# Patient Record
Sex: Female | Born: 1995 | Race: White | Hispanic: No | Marital: Married | Smoking: Never smoker
Health system: Southern US, Community
[De-identification: ages and names within clinical notes are randomized; demographics above are authoritative.]

## PROBLEM LIST (undated history)

## (undated) ENCOUNTER — Inpatient Hospital Stay (HOSPITAL_COMMUNITY): Payer: Self-pay

## (undated) DIAGNOSIS — F419 Anxiety disorder, unspecified: Secondary | ICD-10-CM

## (undated) DIAGNOSIS — F909 Attention-deficit hyperactivity disorder, unspecified type: Secondary | ICD-10-CM

## (undated) HISTORY — DX: Anxiety disorder, unspecified: F41.9

## (undated) HISTORY — DX: Attention-deficit hyperactivity disorder, unspecified type: F90.9

---

## 2016-01-27 DIAGNOSIS — F988 Other specified behavioral and emotional disorders with onset usually occurring in childhood and adolescence: Secondary | ICD-10-CM | POA: Insufficient documentation

## 2019-08-26 DIAGNOSIS — F419 Anxiety disorder, unspecified: Secondary | ICD-10-CM | POA: Insufficient documentation

## 2020-05-08 NOTE — L&D Delivery Note (Addendum)
OB/GYN Faculty Practice Delivery Note  Carol Clark is a 25 y.o. G1P0000 s/p VAVD at [redacted]w[redacted]d. She was admitted for SROM.   ROM: 29h 67m with clear fluid GBS Status: negative Maximum Maternal Temperature: 99  Labor Progress: Presented SROM, was in the MAU while awaiting bed. Once up on L&D not in labor despite SROM for > 6hours so cooks catheter inserted and buccal cytotec given. Patient's cooks catheter eventually came out and she was started on pitocin. Fetal heart rate with intermittent periods of prolonged decelerations so pitocin turned off. Ultimately she progressed to complete with expectant management  Delivery Date/Time: 1715 on 12/2 Delivery: Called to room and patient was complete and pushing. Due to fetal recurrent decelerations to the 90s with every contraction vacuum delivery was offered. Verbal consent: obtained from patient.  Risks and benefits discussed in detail.  Risks include, but are not limited to the risks of anesthesia, bleeding, infection, damage to maternal tissues, fetal cephalhematoma.  There is also the risk of inability to effect vaginal delivery of the head, or shoulder dystocia that cannot be resolved by established maneuvers, leading to the need for emergency cesarean section.  Mytivac bell vacuum applied to fetal head at +2 station and pulled on with contractions. Head delivered OA with significant caput and appeared to likely be in asynclitic position previously. No nuchal cord present. Shoulder and body delivered in usual fashion. Infant with spontaneous cry, placed on mother's abdomen, dried and stimulated. Cord clamped x 2 within 10 seconds of delivery due to insufficient respiratory effort and cut by father of baby. Cord blood drawn. Placenta delivered spontaneously with gentle cord traction. Fundus firm with massage and Pitocin. Labia, perineum, vagina, and cervix inspected and found to have a second degree laceration that was repaired with 3-0 vicryl and also  bilateral periurethrals that were repaired with 4-0 monocryl interrupted sutures..   APGAR: 5, 8; weight  .   Placenta status: intact - sent to pathology   Cord: 3VC with the following complications: none.  Anesthesia: Epidural Instruments: MityVac Episiotomy: None Lacerations: 2nd degree;Perineal Suture Repair: 3.0 vicryl, 4-0 Monocryl Est. Blood Loss (mL):     Mom to postpartum.  Baby to Couplet care / Skin to Skin.  Dr. Alvester Morin present for entire delivery  Carol Clark 04/08/2021, 5:59 PM  Carol Mccreedy, MD, MPH OB Fellow, [facility] for [facility], [facility] 04/08/2021, 6:18 PM

## 2020-05-19 ENCOUNTER — Other Ambulatory Visit: Payer: Self-pay

## 2020-05-19 ENCOUNTER — Other Ambulatory Visit (HOSPITAL_COMMUNITY)
Admission: RE | Admit: 2020-05-19 | Discharge: 2020-05-19 | Disposition: A | Discharge status: Home / Self Care | Payer: 59 | Source: Ambulatory Visit | Attending: Obstetrics & Gynecology | Admitting: Obstetrics & Gynecology

## 2020-05-19 ENCOUNTER — Ambulatory Visit: Payer: 59 | Admitting: Obstetrics & Gynecology

## 2020-05-19 ENCOUNTER — Encounter: Payer: Self-pay | Admitting: Obstetrics & Gynecology

## 2020-05-19 VITALS — BP 108/65 | HR 67 | Ht 66.0 in | Wt 174.0 lb

## 2020-05-19 DIAGNOSIS — Z01419 Encounter for gynecological examination (general) (routine) without abnormal findings: Secondary | ICD-10-CM

## 2020-05-19 DIAGNOSIS — L700 Acne vulgaris: Secondary | ICD-10-CM | POA: Diagnosis not present

## 2020-05-19 DIAGNOSIS — N920 Excessive and frequent menstruation with regular cycle: Secondary | ICD-10-CM | POA: Diagnosis not present

## 2020-05-19 DIAGNOSIS — Z124 Encounter for screening for malignant neoplasm of cervix: Secondary | ICD-10-CM

## 2020-05-19 DIAGNOSIS — Z30432 Encounter for removal of intrauterine contraceptive device: Secondary | ICD-10-CM

## 2020-05-19 NOTE — Progress Notes (Signed)
Patient just moved to the area. Patient would like her IUD removed - her and her husband would like to try and start a family. Armandina Stammer RN

## 2020-05-19 NOTE — Progress Notes (Signed)
25 y.o. G0P0000 Married White or Caucasian female here for new patient exam/establishing care.  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  She has a paraguard IUD placed in 2019 at [facility].  She can feel her strings.  she is ready to start trying for pregnancy.  Prenatal vitamin recommended.  She has been on iron due to anemia she developed with the Paragard IUD.        Sexually active: Yes.    The current method of family planning is IUD.    Exercising: Yes.   Smoker:  no  Health Maintenance: Pap:  2019, does not have results History of abnormal Pap:  no MMG:  never   reports that she has never smoked. She has never used smokeless tobacco. She reports that she does not drink alcohol and does not use drugs.  History reviewed. No pertinent past medical history.  History reviewed. No pertinent surgical history.  Current Outpatient Medications  Medication Sig Dispense Refill  . escitalopram (LEXAPRO) 20 MG tablet Take by mouth.    . paragard intrauterine copper IUD IUD by Intrauterine route.     No current facility-administered medications for this visit.    Family History  Problem Relation Age of Onset  . Hypertension Father   . Cancer Neg Hx   . Diabetes Neg Hx     Review of Systems  Genitourinary: Positive for menstrual problem (flow is heavier and longer since paragard placement).  All other systems reviewed and are negative.   Exam:   BP 108/65   Pulse 67   Ht 5\' 6"  (1.676 m)   Wt 174 lb (78.9 kg)   BMI 28.08 kg/m   Height: 5\' 6"  (167.6 cm)  General appearance: alert, cooperative and appears stated age Head: Normocephalic, without obvious abnormality, atraumatic Neck: no adenopathy, supple, symmetrical, trachea midline and thyroid normal to inspection and palpation Lungs: clear to auscultation bilaterally Breasts: normal appearance, no masses or tenderness Heart: regular rate and rhythm Abdomen: soft,  non-tender; bowel sounds normal; no masses,  no organomegaly Extremities: extremities normal, atraumatic, no cyanosis or edema Skin: Skin color, texture, turgor normal. No rashes or lesions Lymph nodes: Cervical, supraclavicular, and axillary nodes normal. No abnormal inguinal nodes palpated Neurologic: Grossly normal   Pelvic: External genitalia:  no lesions              Urethra:  normal appearing urethra with no masses, tenderness or lesions              Bartholins and Skenes: normal                 Vagina: normal appearing vagina with normal color and discharge, no lesions              Cervix: no lesions and IUD string noted              Pap taken: Yes.   Bimanual Exam:  Uterus:  normal size, contour, position, consistency, mobility, non-tender              Adnexa: normal adnexa and no mass, fullness, tenderness               Rectovaginal: Confirms               Anus:  normal sphincter tone, no lesions  Procedure:  Speculum placed.  IUD string noted.  String grasped with ringed forceps and removed with one  pull.  Pt tolerated procedure well.  Minimal bleeding noted.  Speculum removed.  Chaperone, Lorelle Gibbs, CMA, was present for exam.  Assessment/Plan: 1. Well woman exam with routine gynecological exam  2. Cervical cancer screening - Cytology - PAP( [place])  3. Menorrhagia with regular cycle -this was related to paragard placement.  Pt knows to call if has any future bleeding concerns.  4. Acne vulgaris  5. Encounter for IUD removal -s/p removal today  6.  Anxiety -on Lexapro.  Does not need rx.

## 2020-05-21 LAB — CYTOLOGY - PAP
Comment: NEGATIVE
Diagnosis: UNDETERMINED — AB
High risk HPV: NEGATIVE

## 2020-08-30 ENCOUNTER — Other Ambulatory Visit: Payer: Self-pay

## 2020-08-30 ENCOUNTER — Encounter (HOSPITAL_BASED_OUTPATIENT_CLINIC_OR_DEPARTMENT_OTHER): Payer: Self-pay | Admitting: Obstetrics & Gynecology

## 2020-08-30 ENCOUNTER — Ambulatory Visit (INDEPENDENT_AMBULATORY_CARE_PROVIDER_SITE_OTHER): Payer: 59 | Admitting: Obstetrics & Gynecology

## 2020-08-30 VITALS — BP 118/71 | HR 84 | Ht 66.0 in | Wt 186.0 lb

## 2020-08-30 DIAGNOSIS — F419 Anxiety disorder, unspecified: Secondary | ICD-10-CM | POA: Diagnosis not present

## 2020-08-30 DIAGNOSIS — Z3A01 Less than 8 weeks gestation of pregnancy: Secondary | ICD-10-CM

## 2020-08-30 DIAGNOSIS — Z3201 Encounter for pregnancy test, result positive: Secondary | ICD-10-CM

## 2020-08-30 LAB — POCT URINE PREGNANCY: Preg Test, Ur: POSITIVE — AB

## 2020-08-30 NOTE — Progress Notes (Signed)
Carol Clark is a 25 y.o. female here for a f/uon 2 home pregnancy test at home.

## 2020-08-30 NOTE — Progress Notes (Signed)
GYNECOLOGY  VISIT  CC:   Confirmation of pregnancy  HPI: 25 y.o. G33P0000 Married White or Caucasian female here for confirmation of pregnancy.  Has done home pregnancy test x 2.  LMP 07/09/20, EGA 7 5/7.  EDC based on LMP 04/15/2021.  Having some nausea.  No emesis.  Breasts are tender.  Denies bleeding or pelvic cramping.  Husband with her today.  They are very excited.  No cats in the home.  Tdap 2018.  D/w pt current guidelines for updating in pregnancy.  Aware flu vaccine safe and recommended.   Fish/shellfish/mercury discuss.  Unpasteurized cheese/juices discussed.  Nitrites in foods disucssed.  Exercise and intercourse discussed.  Fetal DNA particle testing discussed.  First trimester down's testing discussed.  Cystic fibrosis screening, SMA screening and hemoglobinopathy screening discussed.  Will do at new ob visit.  Lexapro risks reviewed.  Does cross placenta and does enter breast milk. Established post partum risks reviewed with pt as well.  She is not sure if wants to change dosage.  Is seeing PCP next week who wrote this for her.  Wants to discuss then.  Will let me know if makes any changes.  Risks of post partum depression discussed.  Current Up to Date recommendations reviewed with pt.  GYNECOLOGIC HISTORY: Patient's last menstrual period was 07/09/2020 (exact date).   Patient Active Problem List   Diagnosis Date Noted  . Acne vulgaris 05/19/2020  . Menorrhagia with regular cycle 05/19/2020  . Anxiety 08/26/2019  . ADD (attention deficit disorder) 01/27/2016    Past Medical History:  Diagnosis Date  . ADHD   . Anxiety     No past surgical history on file.  MEDS:   Current Outpatient Medications on File Prior to Visit  Medication Sig Dispense Refill  . escitalopram (LEXAPRO) 20 MG tablet Take by mouth.     No current facility-administered medications on file prior to visit.    ALLERGIES: Patient has no allergy information on record.  Family History  Problem  Relation Age of Onset  . Hypertension Father   . Cancer Neg Hx   . Diabetes Neg Hx     SH:  Married, non smoker  Review of Systems  Constitutional: Negative.   Gastrointestinal: Positive for nausea.    PHYSICAL EXAMINATION:    BP 118/71   Pulse 84   Ht 5\' 6"  (1.676 m)   Wt 186 lb (84.4 kg)   LMP 07/09/2020 (Exact Date)   BMI 30.02 kg/m     General appearance: alert, cooperative and appears stated age Abdomen: soft, non-tender; bowel sounds normal; no masses,  no organomegaly Attempted to hear fetal heart tones with doppler but unsuccessful  Chaperone, 09/08/2020, CMA, was present for exam.  Assessment/Plan: 1. Less than [redacted] weeks gestation of pregnancy - POCT urine pregnancy - Continue PNV - Pt is considering adjust Lexapro.  Will see PCP next week. - return for New Ob visit in about 3 weeks.  2.  Anxiety  Total time with visit:  35 mintues

## 2020-09-01 ENCOUNTER — Ambulatory Visit (HOSPITAL_BASED_OUTPATIENT_CLINIC_OR_DEPARTMENT_OTHER): Payer: 59 | Admitting: Obstetrics & Gynecology

## 2020-09-15 ENCOUNTER — Encounter (HOSPITAL_BASED_OUTPATIENT_CLINIC_OR_DEPARTMENT_OTHER): Payer: Self-pay

## 2020-09-15 DIAGNOSIS — R002 Palpitations: Secondary | ICD-10-CM

## 2020-09-22 NOTE — Telephone Encounter (Signed)
FYI

## 2020-09-23 ENCOUNTER — Ambulatory Visit (INDEPENDENT_AMBULATORY_CARE_PROVIDER_SITE_OTHER): Payer: 59 | Admitting: *Deleted

## 2020-09-23 ENCOUNTER — Encounter (HOSPITAL_BASED_OUTPATIENT_CLINIC_OR_DEPARTMENT_OTHER): Payer: 59

## 2020-09-23 ENCOUNTER — Other Ambulatory Visit (HOSPITAL_BASED_OUTPATIENT_CLINIC_OR_DEPARTMENT_OTHER)
Admission: RE | Admit: 2020-09-23 | Discharge: 2020-09-23 | Disposition: A | Discharge status: Home / Self Care | Payer: 59 | Source: Ambulatory Visit | Attending: Obstetrics & Gynecology | Admitting: Obstetrics & Gynecology

## 2020-09-23 ENCOUNTER — Encounter (HOSPITAL_BASED_OUTPATIENT_CLINIC_OR_DEPARTMENT_OTHER): Payer: Self-pay | Admitting: *Deleted

## 2020-09-23 ENCOUNTER — Encounter (HOSPITAL_BASED_OUTPATIENT_CLINIC_OR_DEPARTMENT_OTHER): Payer: 59 | Admitting: Obstetrics & Gynecology

## 2020-09-23 ENCOUNTER — Ambulatory Visit (INDEPENDENT_AMBULATORY_CARE_PROVIDER_SITE_OTHER): Payer: 59 | Admitting: Obstetrics & Gynecology

## 2020-09-23 ENCOUNTER — Other Ambulatory Visit: Payer: Self-pay

## 2020-09-23 ENCOUNTER — Encounter (HOSPITAL_BASED_OUTPATIENT_CLINIC_OR_DEPARTMENT_OTHER): Payer: Self-pay | Admitting: Obstetrics & Gynecology

## 2020-09-23 VITALS — BP 117/73 | HR 81 | Wt 187.0 lb

## 2020-09-23 DIAGNOSIS — Z3401 Encounter for supervision of normal first pregnancy, first trimester: Secondary | ICD-10-CM

## 2020-09-23 DIAGNOSIS — Z3A10 10 weeks gestation of pregnancy: Secondary | ICD-10-CM | POA: Diagnosis present

## 2020-09-23 DIAGNOSIS — Z331 Pregnant state, incidental: Secondary | ICD-10-CM

## 2020-09-23 DIAGNOSIS — Z3400 Encounter for supervision of normal first pregnancy, unspecified trimester: Secondary | ICD-10-CM | POA: Insufficient documentation

## 2020-09-23 DIAGNOSIS — F419 Anxiety disorder, unspecified: Secondary | ICD-10-CM

## 2020-09-23 LAB — CBC
HCT: 36.2 % (ref 36.0–46.0)
Hemoglobin: 12.2 g/dL (ref 12.0–15.0)
MCH: 29.2 pg (ref 26.0–34.0)
MCHC: 33.7 g/dL (ref 30.0–36.0)
MCV: 86.6 fL (ref 80.0–100.0)
Platelets: 236 10*3/uL (ref 150–400)
RBC: 4.18 MIL/uL (ref 3.87–5.11)
RDW: 12.6 % (ref 11.5–15.5)
WBC: 7.1 10*3/uL (ref 4.0–10.5)
nRBC: 0 % (ref 0.0–0.2)

## 2020-09-23 LAB — POCT URINALYSIS DIPSTICK OB
Appearance: NORMAL
Bilirubin, UA: NEGATIVE
Glucose, UA: NEGATIVE
Ketones, UA: NEGATIVE
Leukocytes, UA: NEGATIVE
Nitrite, UA: NEGATIVE
POC,PROTEIN,UA: NEGATIVE
Spec Grav, UA: 1.025 (ref 1.010–1.025)
Urobilinogen, UA: 0.2 E.U./dL
pH, UA: 6 (ref 5.0–8.0)

## 2020-09-23 LAB — ANTIBODY SCREEN: Antibody Screen: NEGATIVE

## 2020-09-23 LAB — HEPATITIS C ANTIBODY: HCV Ab: NONREACTIVE

## 2020-09-23 LAB — HEPATITIS B SURFACE ANTIGEN: Hepatitis B Surface Ag: NONREACTIVE

## 2020-09-23 LAB — HIV ANTIBODY (ROUTINE TESTING W REFLEX): HIV Screen 4th Generation wRfx: NONREACTIVE

## 2020-09-24 LAB — ABO/RH: ABO/RH(D): A POS

## 2020-09-24 LAB — RUBELLA SCREEN: Rubella: 2.04 {index}

## 2020-09-24 LAB — SYPHILIS: RPR W/REFLEX TO RPR TITER AND TREPONEMAL ANTIBODIES, TRADITIONAL SCREENING AND DIAGNOSIS ALGORITHM: RPR Ser Ql: NONREACTIVE

## 2020-09-25 NOTE — Progress Notes (Signed)
History:   Carol Clark is a 25 y.o. G1P0000 at [redacted]w[redacted]d by LMP being seen today for her first obstetrical visit.  Her obstetrical history is significant for anxiety. Patient does intend to breast feed. Pregnancy history fully reviewed.  Patient reports nausea.  No emesis.  Feeling pretty good.  Denies vaginal bleeding.   HISTORY: OB History  Gravida Para Term Preterm AB Living  1 0 0 0 0 0  SAB IAB Ectopic Multiple Live Births  0 0 0 0 0    # Outcome Date GA Lbr Len/2nd Weight Sex Delivery Anes PTL Lv  1 Current             Last pap smear was done 05/19/2020 and was ASCUS with neg HR HPV.  Past Medical History:  Diagnosis Date  . ADHD   . Anxiety    History reviewed. No pertinent surgical history. Family History  Problem Relation Age of Onset  . Hypertension Father   . Hyperlipidemia Father   . Fibromyalgia Mother   . Hyperlipidemia Maternal Grandmother   . Hyperlipidemia Maternal Grandfather   . Depression Maternal Grandfather   . COPD Paternal Grandmother        pancreatic  . Heart disease Paternal Grandfather   . Cancer Neg Hx   . Diabetes Neg Hx    Social History   Tobacco Use  . Smoking status: Never Smoker  . Smokeless tobacco: Never Used  Vaping Use  . Vaping Use: Never used  Substance Use Topics  . Alcohol use: Never  . Drug use: Never   No Known Allergies Current Outpatient Medications on File Prior to Visit  Medication Sig Dispense Refill  . escitalopram (LEXAPRO) 20 MG tablet Take 10 mg by mouth.     No current facility-administered medications on file prior to visit.   Review of Systems Pertinent items noted in HPI and remainder of comprehensive ROS otherwise negative.  Physical Exam:   Vitals:   09/23/20 1225  BP: 117/73  Pulse: 81  Weight: 187 lb (84.8 kg)   Fetal Heart Rate (bpm): 166 Bedside Ultrasound for FHR check: Viable intrauterine pregnancy with positive cardiac activity noted, fetal heart rate 166bpm Patient informed that  the ultrasound is considered a limited obstetric ultrasound and is not intended to be a complete ultrasound exam.  Patient also informed that the ultrasound is not being completed with the intent of assessing for fetal or placental anomalies or any pelvic abnormalities.  Explained that the purpose of today's ultrasound is to assess for fetal heart rate.  Patient acknowledges the purpose of the exam and the limitations of the study.  Bedside scan was consistent with dating by LMP.  General: well-developed, well-nourished female in no acute distress  Breasts:  normal appearance, no masses or tenderness bilaterally  Skin: normal coloration and turgor, no rashes  Neurologic: oriented, normal, negative, normal mood  Extremities: normal strength, tone, and muscle mass, ROM of all joints is normal  HEENT PERRLA, extraocular movement intact and sclera clear, anicteric  Neck supple and no masses  Cardiovascular: regular rate and rhythm  Respiratory:  no respiratory distress, normal breath sounds  Abdomen: soft, non-tender; bowel sounds normal; no masses,  no organomegaly  Pelvic: deferred    Assessment:    Pregnancy: G1P0000 Patient Active Problem List   Diagnosis Date Noted  . Supervision of normal first pregnancy 09/23/2020  . Anxiety 08/26/2019  . ADD (attention deficit disorder) 01/27/2016     Plan:  1. Encounter for supervision of normal first pregnancy in first trimester - POC Urinalysis Dipstick OB - ABO/Rh; Future - Antibody screen; Future - CBC; Future - Hepatitis B surface antigen; Future - HIV Antibody (routine testing w rflx); Future - HIV (Save tube for possible reflex); Future - RPR; Future - Rubella screen; Future - Hepatitis C antibody; Future - Urine Culture; Future - Cervicovaginal ancillary only( [place]) - Babyscripts Schedule Optimization  2. Pregnant state, incidental  3. [redacted] weeks gestation of pregnancy  4. Anxiety - continue Lexapro 10mg   daily.  Initial labs drawn. Continue prenatal vitamins. Problem list reviewed and updated. Genetic Screening discussed, NIPS: requested. Ultrasound discussed; fetal anatomic survey: requested. Anticipatory guidance about prenatal visits given including labs, ultrasounds, and testing. Discussed usage of Babyscripts and virtual visits as additional source of managing and completing prenatal visits in midst of coronavirus and pandemic.   Encouraged to complete MyChart Registration for her ability to review results, send requests, and have questions addressed.  The nature of [place] [facility] Healthcare/Faculty Practice with multiple MDs and Advanced Practice Providers was explained to patient; also emphasized that residents, students are part of our team. Routine obstetric precautions reviewed. Encouraged to seek out care at office or emergency room [facility] MAU preferred) for urgent and/or emergent concerns. Return in about 4 weeks (around 10/21/2020) for Office ob visit (MD or APP).    10/23/2020, MD, FACOG Obstetrician & Gynecologist, [facility] for [facility], [facility]

## 2020-09-29 LAB — URINE CULTURE: Culture: <10000 — AB

## 2020-10-01 NOTE — Progress Notes (Signed)
New Ob intake completed by Raechel Ache RN.

## 2020-10-04 ENCOUNTER — Encounter (HOSPITAL_BASED_OUTPATIENT_CLINIC_OR_DEPARTMENT_OTHER): Payer: Self-pay

## 2020-10-05 DIAGNOSIS — F909 Attention-deficit hyperactivity disorder, unspecified type: Secondary | ICD-10-CM | POA: Insufficient documentation

## 2020-10-07 ENCOUNTER — Encounter (HOSPITAL_BASED_OUTPATIENT_CLINIC_OR_DEPARTMENT_OTHER): Payer: Self-pay | Admitting: Obstetrics & Gynecology

## 2020-10-07 LAB — GENETIC SCREENING

## 2020-10-08 ENCOUNTER — Ambulatory Visit (INDEPENDENT_AMBULATORY_CARE_PROVIDER_SITE_OTHER): Payer: 59 | Admitting: Cardiology

## 2020-10-08 ENCOUNTER — Ambulatory Visit (INDEPENDENT_AMBULATORY_CARE_PROVIDER_SITE_OTHER): Payer: 59

## 2020-10-08 ENCOUNTER — Other Ambulatory Visit: Payer: Self-pay

## 2020-10-08 ENCOUNTER — Encounter: Payer: Self-pay | Admitting: Cardiology

## 2020-10-08 VITALS — BP 106/75 | HR 83 | Ht 66.0 in | Wt 187.8 lb

## 2020-10-08 DIAGNOSIS — R002 Palpitations: Secondary | ICD-10-CM

## 2020-10-08 DIAGNOSIS — I498 Other specified cardiac arrhythmias: Secondary | ICD-10-CM

## 2020-10-08 DIAGNOSIS — R55 Syncope and collapse: Secondary | ICD-10-CM

## 2020-10-08 NOTE — Patient Instructions (Signed)
Medication Instructions:  Your physician recommends that you continue on your current medications as directed. Please refer to the Current Medication list given to you today.  *If you need a refill on your cardiac medications before your next appointment, please call your pharmacy*   Lab Work: None  If you have labs (blood work) drawn today and your tests are completely normal, you will receive your results only by: Marland Kitchen MyChart Message (if you have MyChart) OR . A paper copy in the mail If you have any lab test that is abnormal or we need to change your treatment, we will call you to review the results.   Testing/Procedures: Your physician has requested that you have an echocardiogram. Echocardiography is a painless test that uses sound waves to create images of your heart. It provides your doctor with information about the size and shape of your heart and how well your heart's chambers and valves are working. This procedure takes approximately one hour. There are no restrictions for this procedure.  A zio monitor was ordered today. It will remain on for 7 days. You will then return monitor and event diary in provided box. It takes 1-2 weeks for report to be downloaded and returned to [place]. We will call you with the results. If monitor falls off or has orange flashing light, please call Zio for further instructions.     Follow-Up: At [facility], you and your health needs are our priority.  As part of our continuing mission to provide you with exceptional heart care, we have created designated Provider Care Teams.  These Care Teams include your primary Cardiologist (physician) and Advanced Practice Providers (APPs -  Physician Assistants and Nurse Practitioners) who all work together to provide you with the care you need, when you need it.  We recommend signing up for the patient portal called "MyChart".  Sign up information is provided on this After Visit Summary.  MyChart is used to connect  with patients for Virtual Visits (Telemedicine).  Patients are able to view lab/test results, encounter notes, upcoming appointments, etc.  Non-urgent messages can be sent to your provider as well.   To learn more about what you can do with MyChart, go to [email or web address].    Your next appointment:   6 month(s)  The format for your next appointment:   In Person  Provider:   [facility] - Kardie Tobb, DO   Other Instructions ZIO  WHY IS MY DOCTOR PRESCRIBING ZIO? The Zio system is proven and trusted by physicians to detect and diagnose irregular heart rhythms -- and has been prescribed to hundreds of thousands of patients.  The FDA has cleared the Zio system to monitor for many different kinds of irregular heart rhythms. In a study, physicians were able to reach a diagnosis 90% of the time with the Zio system1.  You can wear the Zio monitor -- a small, discreet, comfortable patch -- during your normal day-to-day activity, including while you sleep, shower, and exercise, while it records every single heartbeat for analysis.  1Barrett, P., et al. Comparison of 24 Hour Holter Monitoring Versus 14 Day Novel Adhesive Patch Electrocardiographic Monitoring. American Journal of Medicine, 2014.  ZIO VS. HOLTER MONITORING The Zio monitor can be comfortably worn for up to 14 days. Holter monitors can be worn for 24 to 48 hours, limiting the time to record any irregular heart rhythms you may have. Zio is able to capture data for the 51% of patients who have their  first symptom-triggered arrhythmia after 48 hours.1  LIVE WITHOUT RESTRICTIONS The Zio ambulatory cardiac monitor is a small, unobtrusive, and water-resistant patch--you might even forget you're wearing it. The Zio monitor records and stores every beat of your heart, whether you're sleeping, working out, or showering. Remove on: June 10th 2022  Echocardiogram An echocardiogram is a test that uses sound waves (ultrasound)  to produce images of the heart. Images from an echocardiogram can provide important information about:  Heart size and shape.  The size and thickness and movement of your heart's walls.  Heart muscle function and strength.  Heart valve function or if you have stenosis. Stenosis is when the heart valves are too narrow.  If blood is flowing backward through the heart valves (regurgitation).  A tumor or infectious growth around the heart valves.  Areas of heart muscle that are not working well because of poor blood flow or injury from a heart attack.  Aneurysm detection. An aneurysm is a weak or damaged part of an artery wall. The wall bulges out from the normal force of blood pumping through the body. Tell a health care provider about:  Any allergies you have.  All medicines you are taking, including vitamins, herbs, eye drops, creams, and over-the-counter medicines.  Any blood disorders you have.  Any surgeries you have had.  Any medical conditions you have.  Whether you are pregnant or may be pregnant. What are the risks? Generally, this is a safe test. However, problems may occur, including an allergic reaction to dye (contrast) that may be used during the test. What happens before the test? No specific preparation is needed. You may eat and drink normally. What happens during the test?  You will take off your clothes from the waist up and put on a hospital gown.  Electrodes or electrocardiogram (ECG)patches may be placed on your chest. The electrodes or patches are then connected to a device that monitors your heart rate and rhythm.  You will lie down on a table for an ultrasound exam. A gel will be applied to your chest to help sound waves pass through your skin.  A handheld device, called a transducer, will be pressed against your chest and moved over your heart. The transducer produces sound waves that travel to your heart and bounce back (or "echo" back) to the  transducer. These sound waves will be captured in real-time and changed into images of your heart that can be viewed on a video monitor. The images will be recorded on a computer and reviewed by your health care provider.  You may be asked to change positions or hold your breath for a short time. This makes it easier to get different views or better views of your heart.  In some cases, you may receive contrast through an IV in one of your veins. This can improve the quality of the pictures from your heart. The procedure may vary among health care providers and hospitals.   What can I expect after the test? You may return to your normal, everyday life, including diet, activities, and medicines, unless your health care provider tells you not to do that. Follow these instructions at home:  It is up to you to get the results of your test. Ask your health care provider, or the department that is doing the test, when your results will be ready.  Keep all follow-up visits. This is important. Summary  An echocardiogram is a test that uses sound waves (ultrasound) to produce  images of the heart.  Images from an echocardiogram can provide important information about the size and shape of your heart, heart muscle function, heart valve function, and other possible heart problems.  You do not need to do anything to prepare before this test. You may eat and drink normally.  After the echocardiogram is completed, you may return to your normal, everyday life, unless your health care provider tells you not to do that. This information is not intended to replace advice given to you by your health care provider. Make sure you discuss any questions you have with your health care provider. Document Revised: 12/16/2019 Document Reviewed: 12/16/2019 Elsevier Patient Education  2021 [employer].

## 2020-10-08 NOTE — Progress Notes (Signed)
[facility]io-[facility]stetrics Clinic  New Evaluation  Carol Clark is a 25 year old female with no significant past medical history G1 P0-0-0-0, currently [redacted] weeks pregnant was referred by her OB/GYN to be evaluated for palpitations and near syncope episode.  The patient tells me that couple weeks ago she had intermittent palpitations.  She tells me that she feels she was working hard studying for her boards for graduating from pharmacy school.  Since then she tells me that the palpitations have resolved.  But she has had episodes recently where she will be working and works standing up.  She feels as if she is going to pass out.  She tells me that this past Tuesday she had 2 separate episodes at work.  All of this time she feels well-hydrated.  The patient reports that during her first episode she was working and she feels that she has to sit down few minutes later resolved and then when she was back to work.  She experienced the same feeling.  Thankfully she has not passed out.   Prior CV Studies Reviewed: The following studies were reviewed today:   Past Medical History:  Diagnosis Date  . ADHD   . Anxiety     History reviewed. No pertinent surgical history.    OB History    Gravida  1   Para  0   Term  0   Preterm  0   AB  0   Living  0     SAB  0   IAB  0   Ectopic  0   Multiple  0   Live Births  0               Current Medications: Current Meds  Medication Sig  . escitalopram (LEXAPRO) 20 MG tablet Take 10 mg by mouth.  . ferrous sulfate 325 (65 FE) MG EC tablet Take 325 mg by mouth daily.  . Prenatal Multivit-Min-Fe-FA (PRE-NATAL PO) Take 1 tablet by mouth daily.     Allergies:   Patient has no known allergies.   Social History   Socioeconomic History  . Marital status: Married    Spouse name: Not on file  . Number of children: Not on file  . Years of education: Not on file  . Highest education level: Bachelor's degree (e.g., BA, AB, BS)   Occupational History  . Occupation: Consulting civil engineer  Tobacco Use  . Smoking status: Never Smoker  . Smokeless tobacco: Never Used  Vaping Use  . Vaping Use: Never used  Substance and Sexual Activity  . Alcohol use: Never  . Drug use: Never  . Sexual activity: Yes    Birth control/protection: None  Other Topics Concern  . Not on file  Social History Narrative  . Not on file   Social Determinants of Health   Financial Resource Strain: Low Risk   . Difficulty of Paying Living Expenses: Not hard at all  Food Insecurity: No Food Insecurity  . Worried About Programme researcher, broadcasting/film/video in the Last Year: Never true  . Ran Out of Food in the Last Year: Never true  Transportation Needs: No Transportation Needs  . Lack of Transportation (Medical): No  . Lack of Transportation (Non-Medical): No  Physical Activity: Insufficiently Active  . Days of Exercise per Week: 2 days  . Minutes of Exercise per Session: 10 min  Stress: Stress Concern Present  . Feeling of Stress : To some extent  Social Connections: Socially Integrated  . Frequency of Communication  with Friends and Family: More than three times a week  . Frequency of Social Gatherings with Friends and Family: Twice a week  . Attends Religious Services: More than 4 times per year  . Active Member of Clubs or Organizations: Yes  . Attends Banker Meetings: More than 4 times per year  . Marital Status: Married      Family History  Problem Relation Age of Onset  . Hypertension Father   . Hyperlipidemia Father   . Fibromyalgia Mother   . Hyperlipidemia Maternal Grandmother   . Hyperlipidemia Maternal Grandfather   . Depression Maternal Grandfather   . COPD Paternal Grandmother        pancreatic  . Heart disease Paternal Grandfather   . Cancer Neg Hx   . Diabetes Neg Hx       ROS:   Review of Systems  Constitution: Negative for decreased appetite, fever and weight gain.  HENT: Negative for congestion, ear discharge,  hoarse voice and sore throat.   Eyes: Negative for discharge, redness, vision loss in right eye and visual halos.  Cardiovascular: Reports palpitations and near syncope.  Negative for chest pain, dyspnea on exertion, leg swelling, orthopnea. Respiratory: Negative for cough, hemoptysis, shortness of breath and snoring.   Endocrine: Negative for heat intolerance and polyphagia.  Hematologic/Lymphatic: Negative for bleeding problem. Does not bruise/bleed easily.  Skin: Negative for flushing, nail changes, rash and suspicious lesions.  Musculoskeletal: Negative for arthritis, joint pain, muscle cramps, myalgias, neck pain and stiffness.  Gastrointestinal: Negative for abdominal pain, bowel incontinence, diarrhea and excessive appetite.  Genitourinary: Negative for decreased libido, genital sores and incomplete emptying.  Neurological: Negative for brief paralysis, focal weakness, headaches and loss of balance.  Psychiatric/Behavioral: Negative for altered mental status, depression and suicidal ideas.  Allergic/Immunologic: Negative for HIV exposure and persistent infections.     Labs/EKG Reviewed:    EKG:   EKG is was ordered today.  The ekg ordered today demonstrates sinus rhythm, heart rate 80 bpm with arrhythmia  Recent Labs: 09/23/2020: Hemoglobin 12.2; Platelets 236   Recent Lipid Panel No results found for: CHOL, TRIG, HDL, CHOLHDL, LDLCALC, LDLDIRECT  Physical Exam:    VS:  BP 106/75 (BP Location: Left Arm)   Pulse 83   Ht 5\' 6"  (1.676 m)   Wt 187 lb 12.8 oz (85.2 kg)   LMP 07/09/2020 (Exact Date)   SpO2 99%   BMI 30.31 kg/m     Wt Readings from Last 3 Encounters:  10/08/20 187 lb 12.8 oz (85.2 kg)  09/23/20 187 lb (84.8 kg)  08/30/20 186 lb (84.4 kg)     GEN:  Well nourished, well developed in no acute distress HEENT: Normal NECK: No JVD; No carotid bruits LYMPHATICS: No lymphadenopathy CARDIAC: RRR, no murmurs, rubs, gallops RESPIRATORY:  Clear to auscultation  without rales, wheezing or rhonchi  ABDOMEN: Soft, non-tender, non-distended MUSCULOSKELETAL:  No edema; No deformity  SKIN: Warm and dry NEUROLOGIC:  Alert and oriented x 3 PSYCHIATRIC:  Normal affect    Risk Assessment/Risk Calculators:     CARPREG II Risk Prediction Index Score:  1.  The patient's risk for a primary cardiac event is 5%.            ASSESSMENT & PLAN:    Palpitation Near syncope  I would like to rule out a cardiovascular etiology of this palpitation and near syncope, therefore at this time I would like to placed a zio patch for  7 days. In  additon for the near syncope a transthoracic echocardiogram will be ordered to assess LV/RV function and any structural abnormalities.   I have also educated the patient on using support stockings when she is at work, and teaching her other maneuvers to perform if she ever feels as if she is lightheaded when she is standing.  We discussed her EKG which showed evidence of sinus arrhythmia.  Advised the patient what this means.  All of her questions has been answered.  Once these testing have been performed amd reviewed further reccomendations will be made. For now, I do reccomend that the patient goes to the nearest ED if  symptoms recur.   Dispo: 6 months  Medication Adjustments/Labs and Tests Ordered: Current medicines are reviewed at length with the patient today.  Concerns regarding medicines are outlined above.  Tests Ordered: Orders Placed This Encounter  Procedures  . LONG TERM MONITOR (3-14 DAYS)  . EKG 12-Lead  . ECHOCARDIOGRAM COMPLETE   Medication Changes: No orders of the defined types were placed in this encounter.

## 2020-10-14 DIAGNOSIS — R55 Syncope and collapse: Secondary | ICD-10-CM

## 2020-10-21 ENCOUNTER — Ambulatory Visit (INDEPENDENT_AMBULATORY_CARE_PROVIDER_SITE_OTHER): Payer: 59 | Admitting: Obstetrics & Gynecology

## 2020-10-21 ENCOUNTER — Telehealth (HOSPITAL_BASED_OUTPATIENT_CLINIC_OR_DEPARTMENT_OTHER): Payer: Self-pay

## 2020-10-21 ENCOUNTER — Other Ambulatory Visit: Payer: Self-pay

## 2020-10-21 ENCOUNTER — Encounter (HOSPITAL_BASED_OUTPATIENT_CLINIC_OR_DEPARTMENT_OTHER): Payer: 59 | Admitting: Obstetrics & Gynecology

## 2020-10-21 VITALS — BP 113/75 | HR 73 | Wt 186.0 lb

## 2020-10-21 DIAGNOSIS — F419 Anxiety disorder, unspecified: Secondary | ICD-10-CM

## 2020-10-21 DIAGNOSIS — G4700 Insomnia, unspecified: Secondary | ICD-10-CM | POA: Diagnosis not present

## 2020-10-21 DIAGNOSIS — Z3402 Encounter for supervision of normal first pregnancy, second trimester: Secondary | ICD-10-CM

## 2020-10-21 DIAGNOSIS — Z331 Pregnant state, incidental: Secondary | ICD-10-CM

## 2020-10-21 DIAGNOSIS — Z3A14 14 weeks gestation of pregnancy: Secondary | ICD-10-CM

## 2020-10-21 MED ORDER — SERTRALINE HCL 100 MG PO TABS: 100.0000 mg | ORAL_TABLET | Freq: Every day | ORAL | 2 refills | Status: DC

## 2020-10-21 MED ORDER — HYDROXYZINE HCL 10 MG PO TABS: 10.0000 mg | ORAL_TABLET | Freq: Every evening | ORAL | 0 refills | Status: DC | PRN

## 2020-10-21 NOTE — Telephone Encounter (Signed)
Per Dr. Hyacinth Meeker, I called pharmacy to cancel Hydroxyzine prescription that was sent in. I spoke with Hakeem who has deleted the prescription. tbw

## 2020-10-21 NOTE — Progress Notes (Signed)
   PRENATAL VISIT NOTE  Subjective:  Carol Clark is a 25 y.o. G1P0000 at [redacted]w[redacted]d being seen today for ongoing prenatal care.  She is currently monitored for the following issues for this low-risk pregnancy and has Anxiety; ADD (attention deficit disorder); Supervision of normal first pregnancy; and ADHD on their problem list.  Feels mood is definitely worse.  Feels this is impacting her work and life.  This changed very quickly after decreasing lexapro dosage to 10mg .  Has gone back to 20mg .  Did speak with PCP.  Recommended discussing here.  Flowsheet Row Routine Prenatal from 10/21/2020 in [facility]  PHQ-9 Total Score 9      Patient reports no complaints.  Contractions: Not present.  Denies leaking of fluid.   The following portions of the patient's history were reviewed and updated as appropriate: allergies, current medications, past family history, past medical history, past social history, past surgical history and problem list.   Objective:   Vitals:   10/21/20 1211  BP: 113/75  Pulse: 73  Weight: 186 lb (84.4 kg)    Fetal Status: Fetal Heart Rate (bpm): 148 Fundal Height: 14 cm       General:  Alert, oriented and cooperative. Patient is in no acute distress.  Skin: Skin is warm and dry. No rash noted.   Cardiovascular: Normal heart rate noted  Respiratory: Normal respiratory effort, no problems with respiration noted  Abdomen: Soft, gravid, appropriate for gestational age.  Pain/Pressure: Absent     Pelvic: Cervical exam deferred        Extremities: Normal range of motion.  Edema: None  Mental Status: Normal mood and affect. Normal behavior. Normal judgment and thought content.   Assessment and Plan:  Pregnancy: G1P0000 at [redacted]w[redacted]d 1. [redacted] weeks gestation of pregnancy - on PNV  2. Anxiety - will change medication today.   - sertraline (ZOLOFT) 100 MG tablet; Take 1 tablet (100 mg total) by mouth daily.  Dispense: 30 tablet; Refill: 2  3. Encounter  for supervision of normal first pregnancy in second trimester  4. Insomnia, unspecified type - OTC medications discussed  Preterm labor symptoms and general obstetric precautions including but not limited to vaginal bleeding, contractions, leaking of fluid and fetal movement were reviewed in detail with the patient. Please refer to After Visit Summary for other counseling recommendations.   Return in about 4 weeks (around 11/18/2020).  Future Appointments  Date Time Provider Department Center  11/09/2020 11:35 AM [facility] ECHO 5 [id number]  11/22/2020 11:45 AM [facility], MD [facility]    11/24/2020, MD

## 2020-10-26 ENCOUNTER — Telehealth: Payer: Self-pay | Admitting: Cardiology

## 2020-10-26 NOTE — Telephone Encounter (Signed)
Pt calling to discuss results... please advise

## 2020-10-26 NOTE — Telephone Encounter (Signed)
Call transferred to Dr. Servando Salina at this time as she wanted to discuss these heart monitor results with the patient directly.

## 2020-11-09 ENCOUNTER — Other Ambulatory Visit: Payer: Self-pay

## 2020-11-09 ENCOUNTER — Ambulatory Visit (HOSPITAL_COMMUNITY): Payer: 59 | Attending: Cardiology

## 2020-11-09 DIAGNOSIS — R55 Syncope and collapse: Secondary | ICD-10-CM | POA: Insufficient documentation

## 2020-11-09 LAB — ECHOCARDIOGRAM COMPLETE
Area-P 1/2: 2.79 cm2
S' Lateral: 2.9 cm

## 2020-11-13 ENCOUNTER — Encounter (HOSPITAL_BASED_OUTPATIENT_CLINIC_OR_DEPARTMENT_OTHER): Payer: Self-pay

## 2020-11-17 ENCOUNTER — Encounter (HOSPITAL_BASED_OUTPATIENT_CLINIC_OR_DEPARTMENT_OTHER): Payer: Self-pay | Admitting: *Deleted

## 2020-11-22 ENCOUNTER — Ambulatory Visit (INDEPENDENT_AMBULATORY_CARE_PROVIDER_SITE_OTHER): Payer: 59 | Admitting: Obstetrics & Gynecology

## 2020-11-22 ENCOUNTER — Other Ambulatory Visit (HOSPITAL_BASED_OUTPATIENT_CLINIC_OR_DEPARTMENT_OTHER)
Admission: RE | Admit: 2020-11-22 | Discharge: 2020-11-22 | Disposition: A | Discharge status: Home / Self Care | Payer: 59 | Source: Ambulatory Visit | Attending: Obstetrics & Gynecology | Admitting: Obstetrics & Gynecology

## 2020-11-22 ENCOUNTER — Other Ambulatory Visit: Payer: Self-pay

## 2020-11-22 ENCOUNTER — Encounter (HOSPITAL_BASED_OUTPATIENT_CLINIC_OR_DEPARTMENT_OTHER): Payer: Self-pay | Admitting: Obstetrics & Gynecology

## 2020-11-22 VITALS — BP 98/64 | HR 73 | Wt 192.4 lb

## 2020-11-22 DIAGNOSIS — Z3A10 10 weeks gestation of pregnancy: Secondary | ICD-10-CM | POA: Insufficient documentation

## 2020-11-22 DIAGNOSIS — Z3402 Encounter for supervision of normal first pregnancy, second trimester: Secondary | ICD-10-CM

## 2020-11-22 DIAGNOSIS — O99342 Other mental disorders complicating pregnancy, second trimester: Secondary | ICD-10-CM | POA: Diagnosis not present

## 2020-11-22 DIAGNOSIS — R002 Palpitations: Secondary | ICD-10-CM | POA: Diagnosis not present

## 2020-11-22 DIAGNOSIS — G4700 Insomnia, unspecified: Secondary | ICD-10-CM | POA: Insufficient documentation

## 2020-11-22 DIAGNOSIS — O26892 Other specified pregnancy related conditions, second trimester: Secondary | ICD-10-CM | POA: Insufficient documentation

## 2020-11-22 DIAGNOSIS — F419 Anxiety disorder, unspecified: Secondary | ICD-10-CM

## 2020-11-22 DIAGNOSIS — Z3A19 19 weeks gestation of pregnancy: Secondary | ICD-10-CM

## 2020-11-22 HISTORY — DX: Insomnia, unspecified: G47.00

## 2020-11-22 HISTORY — DX: Palpitations: R00.2

## 2020-11-22 NOTE — Progress Notes (Signed)
   PRENATAL VISIT NOTE  Subjective:  Carol Clark is a 25 y.o. G1P0000 at [redacted]w[redacted]d being seen today for ongoing prenatal care.  She is currently monitored for the following issues for this low-risk pregnancy and has Anxiety; ADD (attention deficit disorder); Supervision of normal first pregnancy; ADHD; Palpitations; and Insomnia on their problem list.  Patient reports no complaints.  Contractions: Not present. Vag. Bleeding: None.  Movement: Present. Denies leaking of fluid.   Did see cardiology.  Wore monitor and had cardiac echo.  Was offered a beta blocker but she declined.  Suggested she get this and have it on hand if needed.  States she will call back cardiology.  The following portions of the patient's history were reviewed and updated as appropriate: allergies, current medications, past family history, past medical history, past social history, past surgical history and problem list.   Objective:   Vitals:   11/22/20 1225  BP: 98/64  Pulse: 73  Weight: 192 lb 6.4 oz (87.3 kg)    Fetal Status: Fetal Heart Rate (bpm): 141 Fundal Height: 20 cm Movement: Present     General:  Alert, oriented and cooperative. Patient is in no acute distress.  Skin: Skin is warm and dry. No rash noted.   Cardiovascular: Normal heart rate noted  Respiratory: Normal respiratory effort, no problems with respiration noted  Abdomen: Soft, gravid, appropriate for gestational age.  Pain/Pressure: Absent     Pelvic: Cervical exam deferred        Extremities: Normal range of motion.  Edema: None  Mental Status: Normal mood and affect. Normal behavior. Normal judgment and thought content.   Assessment and Plan:  Pregnancy: G1P0000 at [redacted]w[redacted]d 1. [redacted] weeks gestation of pregnancy - continue PNV - AFP, Serum, Open Spina Bifida - Anatomy scan is scheduled - prenatal classes discussed  2. Anxiety - on sertraline 100mg  daily  3. Insomnia, unspecified type - using hydroxyzine qhs prn  4. Palpitations - has  seen cardiology.  Will have follow up in 3rd trimest.  Preterm labor symptoms and general obstetric precautions including but not limited to vaginal bleeding, contractions, leaking of fluid and fetal movement were reviewed in detail with the patient. Please refer to After Visit Summary for other counseling recommendations.   Return in about 1 month (around 12/23/2020).  Future Appointments  Date Time Provider Department Center  12/14/2020  2:15 PM [facility] US2 [facility]  12/22/2020  9:15 AM 12/24/2020, MD [facility]  01/17/2021 11:00 AM 03/19/2021, MD [facility]  02/17/2021  9:45 AM 02/19/2021, MD [facility]  03/03/2021  9:45 AM 03/05/2021, MD [facility]  03/17/2021  9:45 AM 13/02/2021, MD [facility]  03/24/2021  9:45 AM 03/26/2021, MD [facility]  04/04/2021  8:15 AM 04/06/2021, MD [facility]  04/11/2021  9:45 AM 14/09/2020, MD [facility]    Jerene Bears, MD

## 2020-11-23 MED ORDER — METOPROLOL TARTRATE 25 MG PO TABS
12.5000 mg | ORAL_TABLET | Freq: Two times a day (BID) | ORAL | 3 refills | Status: DC | PRN
Start: 2020-11-23 — End: 2021-05-13

## 2020-12-01 LAB — AFP, SERUM, OPEN SPINA BIFIDA
AFP MoM: 0.86
AFP Value: 40.5 ng/mL
Gest. Age on Collection Date: 19.4 weeks
Maternal Age At EDD: 27.3 yr
OSBR Risk 1 IN: 10000
Test Results:: NEGATIVE
Weight: 192 [lb_av]

## 2020-12-10 ENCOUNTER — Encounter (HOSPITAL_BASED_OUTPATIENT_CLINIC_OR_DEPARTMENT_OTHER): Payer: Self-pay

## 2020-12-14 ENCOUNTER — Ambulatory Visit: Payer: 59 | Attending: Obstetrics & Gynecology

## 2020-12-14 ENCOUNTER — Other Ambulatory Visit (HOSPITAL_BASED_OUTPATIENT_CLINIC_OR_DEPARTMENT_OTHER): Payer: Self-pay | Admitting: Obstetrics & Gynecology

## 2020-12-14 ENCOUNTER — Other Ambulatory Visit: Payer: Self-pay | Admitting: *Deleted

## 2020-12-14 ENCOUNTER — Other Ambulatory Visit: Payer: Self-pay

## 2020-12-14 DIAGNOSIS — Z363 Encounter for antenatal screening for malformations: Secondary | ICD-10-CM | POA: Insufficient documentation

## 2020-12-14 DIAGNOSIS — O2692 Pregnancy related conditions, unspecified, second trimester: Secondary | ICD-10-CM | POA: Diagnosis present

## 2020-12-14 DIAGNOSIS — R002 Palpitations: Secondary | ICD-10-CM

## 2020-12-14 DIAGNOSIS — O99891 Other specified diseases and conditions complicating pregnancy: Secondary | ICD-10-CM

## 2020-12-14 DIAGNOSIS — Z3A22 22 weeks gestation of pregnancy: Secondary | ICD-10-CM

## 2020-12-14 DIAGNOSIS — Z362 Encounter for other antenatal screening follow-up: Secondary | ICD-10-CM

## 2020-12-22 ENCOUNTER — Other Ambulatory Visit: Payer: Self-pay

## 2020-12-22 ENCOUNTER — Ambulatory Visit (INDEPENDENT_AMBULATORY_CARE_PROVIDER_SITE_OTHER): Payer: 59 | Admitting: Obstetrics & Gynecology

## 2020-12-22 VITALS — BP 101/58 | HR 82 | Wt 197.6 lb

## 2020-12-22 DIAGNOSIS — R002 Palpitations: Secondary | ICD-10-CM

## 2020-12-22 DIAGNOSIS — Z3402 Encounter for supervision of normal first pregnancy, second trimester: Secondary | ICD-10-CM

## 2020-12-22 DIAGNOSIS — F419 Anxiety disorder, unspecified: Secondary | ICD-10-CM

## 2020-12-22 NOTE — Progress Notes (Signed)
   PRENATAL VISIT NOTE  Subjective:  Carol Clark is a 25 y.o. G1P0000 at [redacted]w[redacted]d being seen today for ongoing prenatal care.  She is currently monitored for the following issues for this low-risk pregnancy and has Anxiety; ADD (attention deficit disorder); Supervision of normal first pregnancy; ADHD; Palpitations; and Insomnia on their problem list.  Patient reports no complaints.  Contractions: Not present.  .  Movement: Present. Denies leaking of fluid.   Having growth scans due to being on beta blocker for palpitations.  Questions regarding this answered.  The following portions of the patient's history were reviewed and updated as appropriate: allergies, current medications, past family history, past medical history, past social history, past surgical history and problem list.   Objective:   Vitals:   12/22/20 0916  BP: (!) 101/58  Pulse: 82  Weight: 197 lb 9.6 oz (89.6 kg)    Fetal Status: Fetal Heart Rate (bpm): 131 Fundal Height: 24 cm Movement: Present     General:  Alert, oriented and cooperative. Patient is in no acute distress.  Skin: Skin is warm and dry. No rash noted.   Cardiovascular: Normal heart rate noted  Respiratory: Normal respiratory effort, no problems with respiration noted  Abdomen: Soft, gravid, appropriate for gestational age.  Pain/Pressure: Absent     Pelvic: Cervical exam deferred        Extremities: Normal range of motion.  Edema: None  Mental Status: Normal mood and affect. Normal behavior. Normal judgment and thought content.   Assessment and Plan:  Pregnancy: G1P0000 at [redacted]w[redacted]d  1. Encounter for supervision of normal first pregnancy in second trimester - continue PNV - return in 4 weeks for 2hr GTT, CBC, RPR, Tdap.  All reviewed with pt today.  2. Palpitations - growth scans as recommended by MFM - continues with betablocker as needed  3. Anxiety - on zoloft 100mg  daily  Preterm labor symptoms and general obstetric precautions including  but not limited to vaginal bleeding, contractions, leaking of fluid and fetal movement were reviewed in detail with the patient. Please refer to After Visit Summary for other counseling recommendations.   Return in about 4 weeks (around 01/19/2021) for 2 hr GTT, Tdap, 3rd trimester labs (RPR, HIV, CBC).  Future Appointments  Date Time Provider Department Center  01/12/2021  2:30 PM [facility] NURSE [facility]  01/12/2021  2:45 PM [facility] US4 [facility]  01/17/2021 11:00 AM 03/19/2021, MD [facility]  02/17/2021  9:45 AM 02/19/2021, MD [facility]  03/03/2021  9:45 AM 03/05/2021, MD [facility]  03/17/2021  9:45 AM 13/02/2021, MD [facility]  03/24/2021  9:45 AM 03/26/2021, MD [facility]  04/04/2021  8:15 AM 04/06/2021, MD [facility]  04/11/2021  9:45 AM 14/09/2020, MD [facility]    Jerene Bears, MD

## 2020-12-24 ENCOUNTER — Encounter (HOSPITAL_BASED_OUTPATIENT_CLINIC_OR_DEPARTMENT_OTHER): Payer: Self-pay | Admitting: *Deleted

## 2020-12-28 ENCOUNTER — Encounter: Payer: Self-pay | Admitting: *Deleted

## 2020-12-28 ENCOUNTER — Encounter (HOSPITAL_BASED_OUTPATIENT_CLINIC_OR_DEPARTMENT_OTHER): Payer: Self-pay

## 2020-12-29 ENCOUNTER — Other Ambulatory Visit: Payer: Self-pay

## 2020-12-29 ENCOUNTER — Other Ambulatory Visit (HOSPITAL_BASED_OUTPATIENT_CLINIC_OR_DEPARTMENT_OTHER): Payer: Self-pay | Admitting: *Deleted

## 2020-12-29 ENCOUNTER — Ambulatory Visit (HOSPITAL_BASED_OUTPATIENT_CLINIC_OR_DEPARTMENT_OTHER): Payer: 59

## 2020-12-29 DIAGNOSIS — Z3A24 24 weeks gestation of pregnancy: Secondary | ICD-10-CM

## 2020-12-29 DIAGNOSIS — R42 Dizziness and giddiness: Secondary | ICD-10-CM

## 2020-12-30 LAB — CBC
Hematocrit: 30.3 % — ABNORMAL LOW (ref 34.0–46.6)
Hemoglobin: 9.9 g/dL — ABNORMAL LOW (ref 11.1–15.9)
MCH: 30.2 pg (ref 26.6–33.0)
MCHC: 32.7 g/dL (ref 31.5–35.7)
MCV: 92 fL (ref 79–97)
Platelets: 233 10*3/uL (ref 150–450)
RBC: 3.28 x10E6/uL — ABNORMAL LOW (ref 3.77–5.28)
RDW: 12.9 % (ref 11.7–15.4)
WBC: 8.3 10*3/uL (ref 3.4–10.8)

## 2021-01-12 ENCOUNTER — Other Ambulatory Visit: Payer: Self-pay | Admitting: *Deleted

## 2021-01-12 ENCOUNTER — Encounter: Payer: Self-pay | Admitting: *Deleted

## 2021-01-12 ENCOUNTER — Ambulatory Visit: Payer: 59 | Admitting: *Deleted

## 2021-01-12 ENCOUNTER — Ambulatory Visit: Payer: 59 | Attending: Obstetrics

## 2021-01-12 ENCOUNTER — Other Ambulatory Visit: Payer: Self-pay

## 2021-01-12 VITALS — BP 98/61 | HR 84

## 2021-01-12 DIAGNOSIS — Z3402 Encounter for supervision of normal first pregnancy, second trimester: Secondary | ICD-10-CM | POA: Diagnosis present

## 2021-01-12 DIAGNOSIS — R002 Palpitations: Secondary | ICD-10-CM

## 2021-01-12 DIAGNOSIS — Z3A26 26 weeks gestation of pregnancy: Secondary | ICD-10-CM

## 2021-01-12 DIAGNOSIS — Z362 Encounter for other antenatal screening follow-up: Secondary | ICD-10-CM | POA: Diagnosis not present

## 2021-01-12 DIAGNOSIS — Z789 Other specified health status: Secondary | ICD-10-CM

## 2021-01-14 ENCOUNTER — Other Ambulatory Visit (HOSPITAL_BASED_OUTPATIENT_CLINIC_OR_DEPARTMENT_OTHER): Payer: Self-pay | Admitting: Obstetrics & Gynecology

## 2021-01-14 DIAGNOSIS — Z3A14 14 weeks gestation of pregnancy: Secondary | ICD-10-CM

## 2021-01-14 DIAGNOSIS — F419 Anxiety disorder, unspecified: Secondary | ICD-10-CM

## 2021-01-17 ENCOUNTER — Ambulatory Visit (INDEPENDENT_AMBULATORY_CARE_PROVIDER_SITE_OTHER): Payer: 59 | Admitting: *Deleted

## 2021-01-17 ENCOUNTER — Other Ambulatory Visit: Payer: Self-pay

## 2021-01-17 ENCOUNTER — Encounter (HOSPITAL_BASED_OUTPATIENT_CLINIC_OR_DEPARTMENT_OTHER): Payer: Self-pay | Admitting: *Deleted

## 2021-01-17 ENCOUNTER — Ambulatory Visit (INDEPENDENT_AMBULATORY_CARE_PROVIDER_SITE_OTHER): Payer: 59 | Admitting: Obstetrics & Gynecology

## 2021-01-17 VITALS — BP 107/75 | HR 80 | Wt 205.6 lb

## 2021-01-17 DIAGNOSIS — O99013 Anemia complicating pregnancy, third trimester: Secondary | ICD-10-CM

## 2021-01-17 DIAGNOSIS — Z3A27 27 weeks gestation of pregnancy: Secondary | ICD-10-CM

## 2021-01-17 DIAGNOSIS — Z3402 Encounter for supervision of normal first pregnancy, second trimester: Secondary | ICD-10-CM

## 2021-01-17 DIAGNOSIS — Z131 Encounter for screening for diabetes mellitus: Secondary | ICD-10-CM

## 2021-01-17 DIAGNOSIS — F900 Attention-deficit hyperactivity disorder, predominantly inattentive type: Secondary | ICD-10-CM

## 2021-01-17 DIAGNOSIS — Z3403 Encounter for supervision of normal first pregnancy, third trimester: Secondary | ICD-10-CM

## 2021-01-17 DIAGNOSIS — F419 Anxiety disorder, unspecified: Secondary | ICD-10-CM

## 2021-01-17 DIAGNOSIS — R002 Palpitations: Secondary | ICD-10-CM

## 2021-01-17 DIAGNOSIS — Z23 Encounter for immunization: Secondary | ICD-10-CM

## 2021-01-17 NOTE — Progress Notes (Signed)
   PRENATAL VISIT NOTE  Subjective:  Carol Clark is a 25 y.o. G1P0000 at [redacted]w[redacted]d being seen today for ongoing prenatal care.  She is currently monitored for the following issues for this low-risk pregnancy and has Anxiety; ADD (attention deficit disorder); Supervision of normal first pregnancy; ADHD; Palpitations; and Insomnia on their problem list.  Patient reports  still having some palpitations but not taking metoprolol BID.  Will start with BID dosing and she will let me know how this helps .  Contractions: Not present. Vag. Bleeding: None.  Movement: Present. Denies leaking of fluid.   Lots of work stressors.  The following portions of the patient's history were reviewed and updated as appropriate: allergies, current medications, past family history, past medical history, past social history, past surgical history and problem list.   Objective:   Vitals:   01/17/21 0843  BP: 107/75  Pulse: 80  Weight: 205 lb 9.6 oz (93.3 kg)    Fetal Status: Fetal Heart Rate (bpm): 140   Movement: Present     General:  Alert, oriented and cooperative. Patient is in no acute distress.  Skin: Skin is warm and dry. No rash noted.   Cardiovascular: Normal heart rate noted  Respiratory: Normal respiratory effort, no problems with respiration noted  Abdomen: Soft, gravid, appropriate for gestational age.  Pain/Pressure: Absent     Pelvic: Cervical exam deferred        Extremities: Normal range of motion.  Edema: Trace  Mental Status: Normal mood and affect. Normal behavior. Normal judgment and thought content.   Assessment and Plan:  Pregnancy: G1P0000 at 105w3d 1. [redacted] weeks gestation of pregnancy - 2 hr GTT today - gave Tdap already - CBC, RPR, HIV with labs today - on PVN - started taking iron  2. Encounter for supervision of normal first pregnancy in third trimester  3. Attention deficit hyperactivity disorder (ADHD), predominantly inattentive type  4. Anxiety - on sertraline  5.  Palpitations - will increase to BID dosing of metroprolol - has follow up growth scan scheduled in October  6.  Anemia - on oral iron - will check CBC today.  May need IV iron.  Preterm labor symptoms and general obstetric precautions including but not limited to vaginal bleeding, contractions, leaking of fluid and fetal movement were reviewed in detail with the patient. Please refer to After Visit Summary for other counseling recommendations.   Return in about 4 weeks (around 02/14/2021).  Future Appointments  Date Time Provider Department Center  01/17/2021 11:00 AM Jerene Bears, MD [facility]  02/09/2021  8:15 AM [facility]SE [facility]  02/09/2021  8:30 AM [facility] US3 [facility]  02/17/2021  9:45 AM Jerene Bears, MD [facility]  03/03/2021  9:45 AM Jerene Bears, MD [facility]  03/17/2021  9:45 AM Jerene Bears, MD [facility]  03/24/2021  9:45 AM Jerene Bears, MD [facility]  04/04/2021  8:15 AM Jerene Bears, MD [facility]  04/11/2021  9:45 AM Jerene Bears, MD [facility]    Jerene Bears, MD

## 2021-01-17 NOTE — BH Specialist Note (Signed)
Integrated Behavioral Health via Telemedicine Visit  01/17/2021 Carol Clark [id number]  Number of Integrated Behavioral Health visits: 1 Session Start time: 9:17  Session End time: 9:55 Total time:  66  Referring Provider: Valentina Shaggy, MD Carol/Family location: Home [facility] Provider location: [facility] for [facility]  All persons participating in visit: Carol Clark and [facility] Jung Yurchak   Types of Service: Individual psychotherapy and Video visit  I connected with [name] and/or Lupe Carney Vigna's  n/a  via  Telephone or Engineer, civil (consulting)  (Video is Surveyor, mining) and verified that I am speaking with the correct person using two identifiers. Discussed confidentiality: Yes   I discussed the limitations of telemedicine and the availability of in person appointments.  Discussed there is a possibility of technology failure and discussed alternative modes of communication if that failure occurs.  I discussed that engaging in this telemedicine visit, they consent to the provision of behavioral healthcare and the services will be billed under their insurance.  Carol and/or legal guardian expressed understanding and consented to Telemedicine visit: Yes   Presenting Concerns: Carol and/or family reports the following symptoms/concerns: Increased anxiety and depression attributed to upcoming exam, including difficulty falling asleep; was not feeling anxiety or depression prior to taking exam in May.  Duration of problem: Over three months; Severity of problem: moderate  Carol and/or Family's Strengths/Protective Factors: Social connections, Concrete supports in place (healthy food, safe environments, etc.), Sense of purpose, and Physical Health (exercise, healthy diet, medication compliance, etc.)  Goals Addressed: Carol will:  Reduce symptoms of: anxiety and depression   Increase knowledge  and/or ability of: healthy habits   Demonstrate ability to: Increase motivation to adhere to plan of care  Progress towards Goals: Ongoing  Interventions: Interventions utilized:  Mining engineer, Sleep Hygiene, and Psychoeducation and/or Health Education Standardized Assessments completed:  PHQ9/GAD7 given in past two weeks  Carol and/or Family Response: Pt agrees with treatment plan  Assessment: Carol currently experiencing Adjustment disorder with mixed anxious and depressed mood.   Carol may benefit from psychoeducation and brief therapeutic interventions regarding coping with symptoms of anxiety, depression .  Plan: Follow up with behavioral health clinician on : Two weeks Behavioral recommendations:  -Continue taking Zoloft, Vistaril and iron as prescribed -Spend 20 minutes outdoors every morning (with dog) for two weeks (notice any change in mood and sleep) Referral(s): Integrated [employer] (In Clinic)  I discussed the assessment and treatment plan with the Carol and/or parent/guardian. They were provided an opportunity to ask questions and all were answered. They agreed with the plan and demonstrated an understanding of the instructions.   They were advised to call back or seek an in-person evaluation if the symptoms worsen or if the condition fails to improve as anticipated.  Rae Lips, LCSW  Depression screen [facility] 2/9 01/17/2021 10/21/2020 10/08/2020 09/23/2020  Decreased Interest 1 2 0 2  Down, Depressed, Hopeless 0 2 0 1  PHQ - 2 Score 1 4 0 3  Altered sleeping 2 2 0 1  Tired, decreased energy 2 2 0 3  Change in appetite 0 0 0 0  Feeling bad or failure about yourself  0 0 0 1  Trouble concentrating 0 1 0 2  Moving slowly or fidgety/restless 0 0 0 0  Suicidal thoughts 0 0 0 0  PHQ-9 Score 5 9 0 10  Difficult doing work/chores Not difficult at all Somewhat difficult - -  GAD 7 : Generalized Anxiety Score 01/17/2021 09/23/2020   Nervous, Anxious, on Edge 2 2  Control/stop worrying 1 2  Worry too much - different things 2 2  Trouble relaxing 3 3  Restless 2 1  Easily annoyed or irritable 1 1  Afraid - awful might happen 0 0  Total GAD 7 Score 11 11

## 2021-01-17 NOTE — Progress Notes (Signed)
Pt here for PN2 labwork.

## 2021-01-18 LAB — CBC
Hematocrit: 31.3 % — ABNORMAL LOW (ref 34.0–46.6)
Hemoglobin: 10.2 g/dL — ABNORMAL LOW (ref 11.1–15.9)
MCH: 29.5 pg (ref 26.6–33.0)
MCHC: 32.6 g/dL (ref 31.5–35.7)
MCV: 91 fL (ref 79–97)
Platelets: 253 10*3/uL (ref 150–450)
RBC: 3.46 x10E6/uL — ABNORMAL LOW (ref 3.77–5.28)
RDW: 12.4 % (ref 11.7–15.4)
WBC: 7.7 10*3/uL (ref 3.4–10.8)

## 2021-01-18 LAB — HIV ANTIBODY (ROUTINE TESTING W REFLEX): HIV Screen 4th Generation wRfx: NONREACTIVE

## 2021-01-18 LAB — RPR: RPR Ser Ql: NONREACTIVE

## 2021-01-18 LAB — ANTIBODY SCREEN: Antibody Screen: NEGATIVE

## 2021-01-18 LAB — GLUCOSE TOLERANCE, 2 HOURS W/ 1HR
Glucose, 1 hour: 84 mg/dL (ref 65–179)
Glucose, 2 hour: 82 mg/dL (ref 65–152)
Glucose, Fasting: 71 mg/dL (ref 65–91)

## 2021-01-20 ENCOUNTER — Encounter (HOSPITAL_BASED_OUTPATIENT_CLINIC_OR_DEPARTMENT_OTHER): Payer: 59 | Admitting: Obstetrics & Gynecology

## 2021-01-21 ENCOUNTER — Ambulatory Visit (INDEPENDENT_AMBULATORY_CARE_PROVIDER_SITE_OTHER): Payer: 59 | Admitting: Clinical

## 2021-01-21 DIAGNOSIS — F4323 Adjustment disorder with mixed anxiety and depressed mood: Secondary | ICD-10-CM | POA: Diagnosis not present

## 2021-01-21 NOTE — Patient Instructions (Signed)
[facility] [address] [place], [place] [address] [phone] (main office) [phone] (Cathyrn Deas's office)   

## 2021-01-25 NOTE — BH Specialist Note (Signed)
Pt did not arrive to video visit and did not answer the phone; Left HIPPA-compliant message to call back Justyna Timoney from [facility] at  [facility] at  [phone] (Paidyn Mcferran's office).  ?; left MyChart message for patient.  ? ?

## 2021-02-04 ENCOUNTER — Ambulatory Visit: Payer: Self-pay | Admitting: Clinical

## 2021-02-04 DIAGNOSIS — Z91199 Patient's noncompliance with other medical treatment and regimen due to unspecified reason: Secondary | ICD-10-CM

## 2021-02-09 ENCOUNTER — Other Ambulatory Visit: Payer: Self-pay

## 2021-02-09 ENCOUNTER — Other Ambulatory Visit: Payer: Self-pay | Admitting: Obstetrics

## 2021-02-09 ENCOUNTER — Ambulatory Visit: Payer: 59 | Attending: Obstetrics and Gynecology

## 2021-02-09 ENCOUNTER — Encounter: Payer: Self-pay | Admitting: *Deleted

## 2021-02-09 ENCOUNTER — Ambulatory Visit: Payer: 59 | Admitting: *Deleted

## 2021-02-09 VITALS — BP 100/59 | HR 91

## 2021-02-09 DIAGNOSIS — Z3403 Encounter for supervision of normal first pregnancy, third trimester: Secondary | ICD-10-CM | POA: Insufficient documentation

## 2021-02-09 DIAGNOSIS — O2690 Pregnancy related conditions, unspecified, unspecified trimester: Secondary | ICD-10-CM

## 2021-02-09 DIAGNOSIS — R002 Palpitations: Secondary | ICD-10-CM | POA: Insufficient documentation

## 2021-02-09 DIAGNOSIS — O2693 Pregnancy related conditions, unspecified, third trimester: Secondary | ICD-10-CM

## 2021-02-09 DIAGNOSIS — Z3A30 30 weeks gestation of pregnancy: Secondary | ICD-10-CM

## 2021-02-09 DIAGNOSIS — Z789 Other specified health status: Secondary | ICD-10-CM | POA: Insufficient documentation

## 2021-02-17 ENCOUNTER — Ambulatory Visit (INDEPENDENT_AMBULATORY_CARE_PROVIDER_SITE_OTHER): Payer: 59 | Admitting: Obstetrics & Gynecology

## 2021-02-17 ENCOUNTER — Other Ambulatory Visit: Payer: Self-pay

## 2021-02-17 VITALS — BP 109/72 | HR 85 | Wt 219.0 lb

## 2021-02-17 DIAGNOSIS — R002 Palpitations: Secondary | ICD-10-CM

## 2021-02-17 DIAGNOSIS — Z3403 Encounter for supervision of normal first pregnancy, third trimester: Secondary | ICD-10-CM

## 2021-02-17 DIAGNOSIS — O99013 Anemia complicating pregnancy, third trimester: Secondary | ICD-10-CM

## 2021-02-17 DIAGNOSIS — F4323 Adjustment disorder with mixed anxiety and depressed mood: Secondary | ICD-10-CM

## 2021-02-17 DIAGNOSIS — Z3A31 31 weeks gestation of pregnancy: Secondary | ICD-10-CM

## 2021-02-17 NOTE — Progress Notes (Signed)
   PRENATAL VISIT NOTE  Subjective:  Carol Clark is a 25 y.o. G1P0000 at [redacted]w[redacted]d being seen today for ongoing prenatal care.  She is currently monitored for the following issues for this low-risk pregnancy and has Anxiety; ADD (attention deficit disorder); Supervision of normal first pregnancy; ADHD; Palpitations; and Insomnia on their problem list.  Patient reports no complaints. Denies leaking of fluid.  Has good fetal movement.  Does know how to do kick counts.  No VB.  The following portions of the patient's history were reviewed and updated as appropriate: allergies, current medications, past family history, past medical history, past social history, past surgical history and problem list.   Objective:   Vitals:   02/17/21 0948  BP: 109/72  Pulse: 85  Weight: 219 lb (99.3 kg)    Fetal Status: Fetal Heart Rate (bpm): 137 Fundal Height: 32 cm Movement: Present     General:  Alert, oriented and cooperative. Patient is in no acute distress.  Skin: Skin is warm and dry. No rash noted.   Cardiovascular: Normal heart rate noted  Respiratory: Normal respiratory effort, no problems with respiration noted  Abdomen: Soft, gravid, appropriate for gestational age.  Pain/Pressure: Absent     Pelvic: Cervical exam deferred        Extremities: Normal range of motion.  Edema: Trace  Mental Status: Normal mood and affect. Normal behavior. Normal judgment and thought content.   Assessment and Plan:  Pregnancy: G1P0000 at 110w6d 1. [redacted] weeks gestation of pregnancy - on PNV  2. Encounter for supervision of normal first pregnancy in third trimester  3. Adjustment disorder with mixed anxiety and depressed mood - on Zoloft 100mg  daily  4. Anemia during pregnancy in third trimester - taking iron  5. Palpitations - on metoprolol - has seen cardiology - will have follow up growth scan and this is already scheduled   Preterm labor symptoms and general obstetric precautions including but not  limited to vaginal bleeding, contractions, leaking of fluid and fetal movement were reviewed in detail with the patient. Please refer to After Visit Summary for other counseling recommendations.   Return in about 2 weeks (around 03/03/2021).  Future Appointments  Date Time Provider Department Center  03/03/2021  9:45 AM 03/05/2021, MD [facility]  03/15/2021  8:30 AM [facility] NURSE [facility]  03/15/2021  8:45 AM [facility] US4 [facility]  03/17/2021  9:45 AM 13/02/2021, MD [facility]  03/24/2021  9:45 AM 03/26/2021, MD [facility]  04/04/2021  8:15 AM 04/06/2021, MD [facility]  04/11/2021  9:45 AM 14/09/2020, MD [facility]    Jerene Bears, MD

## 2021-03-03 ENCOUNTER — Ambulatory Visit (INDEPENDENT_AMBULATORY_CARE_PROVIDER_SITE_OTHER): Payer: 59 | Admitting: Obstetrics & Gynecology

## 2021-03-03 ENCOUNTER — Other Ambulatory Visit: Payer: Self-pay

## 2021-03-03 VITALS — BP 111/75 | HR 84 | Wt 220.0 lb

## 2021-03-03 DIAGNOSIS — F4323 Adjustment disorder with mixed anxiety and depressed mood: Secondary | ICD-10-CM

## 2021-03-03 DIAGNOSIS — Z3A33 33 weeks gestation of pregnancy: Secondary | ICD-10-CM

## 2021-03-03 DIAGNOSIS — O99013 Anemia complicating pregnancy, third trimester: Secondary | ICD-10-CM

## 2021-03-03 DIAGNOSIS — Z3403 Encounter for supervision of normal first pregnancy, third trimester: Secondary | ICD-10-CM

## 2021-03-03 NOTE — Progress Notes (Signed)
   PRENATAL VISIT NOTE  Subjective:  Carol Clark is a 25 y.o. G1P0000 at [redacted]w[redacted]d being seen today for ongoing prenatal care.  She is currently monitored for the following issues for this low-risk pregnancy and has Anxiety; ADD (attention deficit disorder); Supervision of normal first pregnancy; ADHD; Palpitations; and Insomnia on their problem list.  Patient reports no complaints.  Contractions: Not present. Vag. Bleeding: None.  Movement: Present. Denies leaking of fluid.   The following portions of the patient's history were reviewed and updated as appropriate: allergies, current medications, past family history, past medical history, past social history, past surgical history and problem list.   Objective:   Vitals:   03/03/21 0955  BP: 111/75  Pulse: 84  Weight: 220 lb (99.8 kg)    Fetal Status: Fetal Heart Rate (bpm): 130 Fundal Height: 34 cm Movement: Present     General:  Alert, oriented and cooperative. Patient is in no acute distress.  Skin: Skin is warm and dry. No rash noted.   Cardiovascular: Normal heart rate noted  Respiratory: Normal respiratory effort, no problems with respiration noted  Abdomen: Soft, gravid, appropriate for gestational age.  Pain/Pressure: Present     Pelvic: Cervical exam deferred        Extremities: Normal range of motion.  Edema: None  Mental Status: Normal mood and affect. Normal behavior. Normal judgment and thought content.   Assessment and Plan:  Pregnancy: G1P0000 at [redacted]w[redacted]d 1. [redacted] weeks gestation of pregnancy - on PNV - recheck 2 weeks.  Plan GBS and GC/Chl testing at that time.  2. Encounter for supervision of normal first pregnancy in third trimester  3. Adjustment disorder with mixed anxiety and depressed mood - has seen cardiology in pregnancy - on Metoprolol - growth scan planned 03/15/2021  4. Anemia during pregnancy in third trimester -on oral iron  Preterm labor symptoms and general obstetric precautions including but not  limited to vaginal bleeding, contractions, leaking of fluid and fetal movement were reviewed in detail with the patient. Please refer to After Visit Summary for other counseling recommendations.   Return in about 2 weeks (around 03/17/2021) for GC/Chl/GBS.  Future Appointments  Date Time Provider Department Center  03/15/2021  8:30 AM [facility] NURSE [facility]  03/15/2021  8:45 AM [facility] US4 [facility]  03/18/2021  9:30 AM Marny Lowenstein, PA-C [facility]  03/24/2021  9:45 AM Jerene Bears, MD [facility]  04/04/2021  8:15 AM Jerene Bears, MD [facility]  04/11/2021  9:45 AM Jerene Bears, MD [facility]    Jerene Bears, MD

## 2021-03-11 ENCOUNTER — Encounter (HOSPITAL_BASED_OUTPATIENT_CLINIC_OR_DEPARTMENT_OTHER): Payer: Self-pay

## 2021-03-15 ENCOUNTER — Ambulatory Visit: Payer: 59 | Attending: Obstetrics

## 2021-03-15 ENCOUNTER — Other Ambulatory Visit: Payer: Self-pay

## 2021-03-15 ENCOUNTER — Ambulatory Visit: Payer: 59 | Admitting: *Deleted

## 2021-03-15 VITALS — BP 97/62 | HR 86

## 2021-03-15 DIAGNOSIS — Z3403 Encounter for supervision of normal first pregnancy, third trimester: Secondary | ICD-10-CM | POA: Diagnosis present

## 2021-03-15 DIAGNOSIS — O2690 Pregnancy related conditions, unspecified, unspecified trimester: Secondary | ICD-10-CM | POA: Insufficient documentation

## 2021-03-15 DIAGNOSIS — Z3A35 35 weeks gestation of pregnancy: Secondary | ICD-10-CM

## 2021-03-15 DIAGNOSIS — Z362 Encounter for other antenatal screening follow-up: Secondary | ICD-10-CM | POA: Diagnosis not present

## 2021-03-17 ENCOUNTER — Encounter (HOSPITAL_BASED_OUTPATIENT_CLINIC_OR_DEPARTMENT_OTHER): Payer: 59 | Admitting: Obstetrics & Gynecology

## 2021-03-18 ENCOUNTER — Encounter (HOSPITAL_BASED_OUTPATIENT_CLINIC_OR_DEPARTMENT_OTHER): Payer: Self-pay

## 2021-03-18 ENCOUNTER — Ambulatory Visit (INDEPENDENT_AMBULATORY_CARE_PROVIDER_SITE_OTHER): Payer: 59 | Admitting: Obstetrics & Gynecology

## 2021-03-18 ENCOUNTER — Other Ambulatory Visit (HOSPITAL_COMMUNITY)
Admission: RE | Admit: 2021-03-18 | Discharge: 2021-03-18 | Disposition: A | Discharge status: Home / Self Care | Payer: 59 | Source: Ambulatory Visit | Attending: Obstetrics & Gynecology | Admitting: Obstetrics & Gynecology

## 2021-03-18 ENCOUNTER — Other Ambulatory Visit: Payer: Self-pay

## 2021-03-18 VITALS — BP 99/64 | HR 63 | Wt 226.4 lb

## 2021-03-18 DIAGNOSIS — Z3A36 36 weeks gestation of pregnancy: Secondary | ICD-10-CM | POA: Diagnosis not present

## 2021-03-18 DIAGNOSIS — Z3403 Encounter for supervision of normal first pregnancy, third trimester: Secondary | ICD-10-CM

## 2021-03-18 DIAGNOSIS — F419 Anxiety disorder, unspecified: Secondary | ICD-10-CM

## 2021-03-18 DIAGNOSIS — R002 Palpitations: Secondary | ICD-10-CM

## 2021-03-18 LAB — OB RESULTS CONSOLE GC/CHLAMYDIA: Gonorrhea: NEGATIVE

## 2021-03-18 MED ORDER — SERTRALINE HCL 50 MG PO TABS: 150.0000 mg | ORAL_TABLET | Freq: Every day | ORAL | 3 refills | Status: DC

## 2021-03-18 NOTE — Progress Notes (Signed)
   PRENATAL VISIT NOTE  Subjective:  Carol Clark is a 25 y.o. G1P0000 at [redacted]w[redacted]d being seen today for ongoing prenatal care.  She is currently monitored for the following issues for this low-risk pregnancy and has Anxiety; ADD (attention deficit disorder); Supervision of normal first pregnancy; ADHD; Palpitations; and Insomnia on their problem list.  Patient reports no complaints.  Contractions: Irregular. Vag. Bleeding: None.  Movement: Present. Denies leaking of fluid.   Bedside u/s performed today to confirm vertex presentation.  Growth scan done 11/8 due to betablocker use.  93% growth noted.  The following portions of the patient's history were reviewed and updated as appropriate: allergies, current medications, past family history, past medical history, past social history, past surgical history and problem list.   Objective:   Vitals:   03/18/21 0944  BP: 99/64  Pulse: 63  Weight: 226 lb 6.4 oz (102.7 kg)    Fetal Status: Fetal Heart Rate (bpm): 138 Fundal Height: 36 cm Movement: Present  Presentation: Vertex  General:  Alert, oriented and cooperative. Patient is in no acute distress.  Skin: Skin is warm and dry. No rash noted.   Cardiovascular: Normal heart rate noted  Respiratory: Normal respiratory effort, no problems with respiration noted  Abdomen: Soft, gravid, appropriate for gestational age.  Pain/Pressure: Absent     Pelvic: Cervical exam deferred        Extremities: Normal range of motion.  Edema: None  Mental Status: Normal mood and affect. Normal behavior. Normal judgment and thought content.   Assessment and Plan:  Pregnancy: G1P0000 at [redacted]w[redacted]d 1. [redacted] weeks gestation of pregnancy - continue PNV - Culture, beta strep (group b only) - Cervicovaginal ancillary only( [place])  2. Anxiety, Adjustment disorder with mixed anxiety/depressed mood - depression inventory positive today.  Sertaline dosage increased 1 week ago.  Pt is improved some.  Needs RF. -  sertraline (ZOLOFT) 50 MG tablet; Take 3 tablets (150 mg total) by mouth daily.  Dispense: 90 tablet; Refill: 3  3. 93% growth on u/s - plan to repeat at 38 weeks  4. Palpitations - on metroprolol - has seen cardiology - follow up growth scan done 03/15/2021  5.  Anemia (hb 10.2 on 01/17/2021) - on oral iron  Preterm labor symptoms and general obstetric precautions including but not limited to vaginal bleeding, contractions, leaking of fluid and fetal movement were reviewed in detail with the patient. Please refer to After Visit Summary for other counseling recommendations.   Return in about 1 week (around 03/25/2021).  Future Appointments  Date Time Provider Department Center  03/24/2021  9:45 AM Jerene Bears, MD [facility]  04/04/2021  8:15 AM Jerene Bears, MD [facility]  04/11/2021  9:45 AM Jerene Bears, MD [facility]    Jerene Bears, MD

## 2021-03-21 ENCOUNTER — Encounter (HOSPITAL_BASED_OUTPATIENT_CLINIC_OR_DEPARTMENT_OTHER): Payer: Self-pay | Admitting: *Deleted

## 2021-03-21 LAB — CERVICOVAGINAL ANCILLARY ONLY
Chlamydia: NEGATIVE
Comment: NEGATIVE
Comment: NORMAL
Neisseria Gonorrhea: NEGATIVE

## 2021-03-23 LAB — CULTURE, BETA STREP (GROUP B ONLY): Strep Gp B Culture: NEGATIVE

## 2021-03-24 ENCOUNTER — Encounter (HOSPITAL_BASED_OUTPATIENT_CLINIC_OR_DEPARTMENT_OTHER): Payer: 59 | Admitting: Obstetrics & Gynecology

## 2021-03-25 ENCOUNTER — Other Ambulatory Visit: Payer: Self-pay

## 2021-03-25 ENCOUNTER — Encounter (HOSPITAL_BASED_OUTPATIENT_CLINIC_OR_DEPARTMENT_OTHER): Payer: Self-pay | Admitting: Medical

## 2021-03-25 ENCOUNTER — Ambulatory Visit (INDEPENDENT_AMBULATORY_CARE_PROVIDER_SITE_OTHER): Payer: 59 | Admitting: Medical

## 2021-03-25 VITALS — BP 102/72 | HR 82 | Wt 224.6 lb

## 2021-03-25 DIAGNOSIS — Z3403 Encounter for supervision of normal first pregnancy, third trimester: Secondary | ICD-10-CM

## 2021-03-25 DIAGNOSIS — Z3A37 37 weeks gestation of pregnancy: Secondary | ICD-10-CM

## 2021-03-25 LAB — CBC
Hematocrit: 34.2 % (ref 34.0–46.6)
Hemoglobin: 11.4 g/dL (ref 11.1–15.9)
MCH: 29.5 pg (ref 26.6–33.0)
MCHC: 33.3 g/dL (ref 31.5–35.7)
MCV: 89 fL (ref 79–97)
Platelets: 272 10*3/uL (ref 150–450)
RBC: 3.86 x10E6/uL (ref 3.77–5.28)
RDW: 13.8 % (ref 11.7–15.4)
WBC: 7.9 10*3/uL (ref 3.4–10.8)

## 2021-03-25 NOTE — Progress Notes (Signed)
   PRENATAL VISIT NOTE  Subjective:  Carol Clark is a 25 y.o. G1P0000 at [redacted]w[redacted]d being seen today for ongoing prenatal care.  She is currently monitored for the following issues for this low-risk pregnancy and has Anxiety; ADD (attention deficit disorder); Supervision of normal first pregnancy; ADHD; Palpitations; and Insomnia on their problem list.  Patient reports pelvic pressure.  Contractions: Irregular. Vag. Bleeding: None.  Movement: Present. Denies leaking of fluid.   The following portions of the patient's history were reviewed and updated as appropriate: allergies, current medications, past family history, past medical history, past social history, past surgical history and problem list.   Objective:   Vitals:   03/25/21 0931  BP: 102/72  Pulse: 82  Weight: 224 lb 9.6 oz (101.9 kg)    Fetal Status: Fetal Heart Rate (bpm): 134 Fundal Height: 37 cm Movement: Present     General:  Alert, oriented and cooperative. Patient is in no acute distress.  Skin: Skin is warm and dry. No rash noted.   Cardiovascular: Normal heart rate noted  Respiratory: Normal respiratory effort, no problems with respiration noted  Abdomen: Soft, gravid, appropriate for gestational age.  Pain/Pressure: Present     Pelvic: Cervical exam deferred        Extremities: Normal range of motion.  Edema: None  Mental Status: Normal mood and affect. Normal behavior. Normal judgment and thought content.   Assessment and Plan:  Pregnancy: G1P0000 at [redacted]w[redacted]d 1. Encounter for supervision of normal first pregnancy in third trimester - CBC, Hgb at last check 10.2 - Planning POPs - Planning [facility], checking with insurance   2. [redacted] weeks gestation of pregnancy  Term labor symptoms and general obstetric precautions including but not limited to vaginal bleeding, contractions, leaking of fluid and fetal movement were reviewed in detail with the patient. Please refer to After Visit Summary for other counseling  recommendations.   Return in about 1 week (around 04/01/2021) for LOB, any provider.  Future Appointments  Date Time Provider Department Center  03/30/2021  9:30 AM Jerene Bears, MD [facility]  04/04/2021  8:15 AM Jerene Bears, MD [facility]  04/11/2021  9:45 AM Jerene Bears, MD [facility]    Vonzella Nipple, PA-C

## 2021-03-30 ENCOUNTER — Ambulatory Visit (INDEPENDENT_AMBULATORY_CARE_PROVIDER_SITE_OTHER): Payer: 59 | Admitting: Obstetrics & Gynecology

## 2021-03-30 ENCOUNTER — Other Ambulatory Visit: Payer: Self-pay

## 2021-03-30 VITALS — BP 119/76 | HR 83 | Wt 229.2 lb

## 2021-03-30 DIAGNOSIS — F419 Anxiety disorder, unspecified: Secondary | ICD-10-CM

## 2021-03-30 DIAGNOSIS — Z3402 Encounter for supervision of normal first pregnancy, second trimester: Secondary | ICD-10-CM

## 2021-03-30 DIAGNOSIS — R002 Palpitations: Secondary | ICD-10-CM

## 2021-03-30 DIAGNOSIS — Z3A37 37 weeks gestation of pregnancy: Secondary | ICD-10-CM

## 2021-03-30 NOTE — Progress Notes (Signed)
   PRENATAL VISIT NOTE  Subjective:  Carol Clark is a 25 y.o. G1P0000 at [redacted]w[redacted]d being seen today for ongoing prenatal care.  She is currently monitored for the following issues for this low-risk pregnancy and has Anxiety; ADD (attention deficit disorder); Supervision of normal first pregnancy; ADHD; Palpitations; and Insomnia on their problem list.  Patient reports no complaints.  Contractions: Not present. Vag. Bleeding: None.  Movement: Present. Denies leaking of fluid.   The following portions of the patient's history were reviewed and updated as appropriate: allergies, current medications, past family history, past medical history, past social history, past surgical history and problem list.   Objective:   Vitals:   03/30/21 0934  BP: 119/76  Pulse: 83  Weight: 229 lb 3.2 oz (104 kg)    Fetal Status: Fetal Heart Rate (bpm): 140 Fundal Height: 38 cm Movement: Present  Presentation: Vertex  General:  Alert, oriented and cooperative. Patient is in no acute distress.  Skin: Skin is warm and dry. No rash noted.   Cardiovascular: Normal heart rate noted  Respiratory: Normal respiratory effort, no problems with respiration noted  Abdomen: Soft, gravid, appropriate for gestational age.  Pain/Pressure: Present     Pelvic: Cervical exam performed in the presence of a chaperone Dilation: 1 Effacement (%): 30 Station: -3  Extremities: Normal range of motion.  Edema: None  Mental Status: Normal mood and affect. Normal behavior. Normal judgment and thought content.   Assessment and Plan:  Pregnancy: G1P0000 at [redacted]w[redacted]d 1. [redacted] weeks gestation of pregnancy - continue PNV and Iron.  Hb improved with CBC last week - labor precautions discussed today  2. Encounter for supervision of normal first pregnancy in third trimester  3. Palpitations - followed by cardiology  4. Anxiety - on sertriline  Term labor symptoms and general obstetric precautions including but not limited to vaginal  bleeding, contractions, leaking of fluid and fetal movement were reviewed in detail with the patient. Please refer to After Visit Summary for other counseling recommendations.   Return in about 1 week (around 04/06/2021).  Future Appointments  Date Time Provider Department Center  04/06/2021  2:30 PM Jerene Bears, MD [facility]  04/11/2021  9:45 AM Jerene Bears, MD [facility]    Jerene Bears, MD

## 2021-04-04 ENCOUNTER — Encounter (HOSPITAL_BASED_OUTPATIENT_CLINIC_OR_DEPARTMENT_OTHER): Payer: 59 | Admitting: Obstetrics & Gynecology

## 2021-04-05 ENCOUNTER — Other Ambulatory Visit (HOSPITAL_BASED_OUTPATIENT_CLINIC_OR_DEPARTMENT_OTHER): Payer: Self-pay | Admitting: Obstetrics & Gynecology

## 2021-04-06 ENCOUNTER — Ambulatory Visit (INDEPENDENT_AMBULATORY_CARE_PROVIDER_SITE_OTHER): Payer: 59 | Admitting: Obstetrics & Gynecology

## 2021-04-06 ENCOUNTER — Other Ambulatory Visit: Payer: Self-pay

## 2021-04-06 VITALS — BP 121/83 | HR 86 | Wt 234.0 lb

## 2021-04-06 DIAGNOSIS — Z3403 Encounter for supervision of normal first pregnancy, third trimester: Secondary | ICD-10-CM

## 2021-04-06 DIAGNOSIS — O99013 Anemia complicating pregnancy, third trimester: Secondary | ICD-10-CM

## 2021-04-06 DIAGNOSIS — F4323 Adjustment disorder with mixed anxiety and depressed mood: Secondary | ICD-10-CM

## 2021-04-06 DIAGNOSIS — O3663X1 Maternal care for excessive fetal growth, third trimester, fetus 1: Secondary | ICD-10-CM

## 2021-04-06 DIAGNOSIS — Z3A38 38 weeks gestation of pregnancy: Secondary | ICD-10-CM

## 2021-04-06 DIAGNOSIS — R002 Palpitations: Secondary | ICD-10-CM

## 2021-04-06 NOTE — Progress Notes (Signed)
   PRENATAL VISIT NOTE  Subjective:  Carol Clark is a 25 y.o. G1P0000 at [redacted]w[redacted]d being seen today for ongoing prenatal care.  She is currently monitored for the following issues for this low-risk pregnancy and has Anxiety; ADD (attention deficit disorder); Supervision of normal first pregnancy; ADHD; Palpitations; and Insomnia on their problem list.  Patient reports no complaints.  Contractions: Not present. Vag. Bleeding: None.  Movement: Present. Denies leaking of fluid.   We reviewed ultrasound from 03/15/2021 where estimated fetal weight was ~3200gm.  Now 3 weeks later so EFW would be near 4000grams.  Given 39 weeks on Friday with cervix 1.5cm dilated and 60% effaced, feel induction reasonable.  D/w Carol Clark and they are comfortable with plan.  The following portions of the patient's history were reviewed and updated as appropriate: allergies, current medications, past family history, past medical history, past social history, past surgical history and problem list.   Objective:   Vitals:   04/06/21 1434  BP: 121/83  Pulse: 86  Weight: 234 lb (106.1 kg)    Fetal Status: Fetal Heart Rate (bpm): 142 Fundal Height: 39 cm Movement: Present  Presentation: Vertex  General:  Alert, oriented and cooperative. Patient is in no acute distress.  Skin: Skin is warm and dry. No rash noted.   Cardiovascular: Normal heart rate noted  Respiratory: Normal respiratory effort, no problems with respiration noted  Abdomen: Soft, gravid, appropriate for gestational age.  Pain/Pressure: Present     Pelvic: Cervical exam performed in the presence of a chaperone Dilation: 1.5 Effacement (%): 60 Station: -3  Extremities: Normal range of motion.  Edema: None  Mental Status: Normal mood and affect. Normal behavior. Normal judgment and thought content.   Assessment and Plan:  Pregnancy: G1P0000 at [redacted]w[redacted]d 1. [redacted] weeks gestation of pregnancy - on PNV  2. LGA (large for gestational age) fetus affecting  management of mother, third trimester, fetus 1 - induction scheduled for 39 weeks - will have foley bulb placed in MAU.  Called and this was scheduled.  Carol knows to go tomorrow after 5pm for placement.  Procedure, risks, benefits all reviewed with her today  3. Encounter for supervision of normal first pregnancy in third trimester  4. Palpitations - on metroprolol  5. Adjustment disorder with mixed anxiety and depressed mood - on sertraline  6. Anemia during pregnancy in third trimester - most recent hb was 11.4.  she is taking oral iron.  Term labor symptoms and general obstetric precautions including but not limited to vaginal bleeding, contractions, leaking of fluid and fetal movement were reviewed in detail with the patient. Please refer to After Visit Summary for other counseling recommendations.   No follow-ups on file.  Future Appointments  Date Time Provider Department Center  04/08/2021  7:15 AM [facility] SCHED ROOM [facility] None    Jerene Bears, MD

## 2021-04-07 ENCOUNTER — Encounter (HOSPITAL_COMMUNITY): Payer: Self-pay | Admitting: Obstetrics and Gynecology

## 2021-04-07 ENCOUNTER — Inpatient Hospital Stay (HOSPITAL_COMMUNITY)
Admission: AD | Admit: 2021-04-07 | Discharge: 2021-04-09 | DRG: 807 | Disposition: A | Discharge status: Home / Self Care | Payer: 59 | Attending: Family Medicine | Admitting: Family Medicine

## 2021-04-07 DIAGNOSIS — Z20822 Contact with and (suspected) exposure to covid-19: Secondary | ICD-10-CM | POA: Diagnosis present

## 2021-04-07 DIAGNOSIS — Z3400 Encounter for supervision of normal first pregnancy, unspecified trimester: Secondary | ICD-10-CM

## 2021-04-07 DIAGNOSIS — O99344 Other mental disorders complicating childbirth: Secondary | ICD-10-CM | POA: Diagnosis present

## 2021-04-07 DIAGNOSIS — O4292 Full-term premature rupture of membranes, unspecified as to length of time between rupture and onset of labor: Secondary | ICD-10-CM | POA: Diagnosis present

## 2021-04-07 DIAGNOSIS — Z3A38 38 weeks gestation of pregnancy: Secondary | ICD-10-CM | POA: Diagnosis not present

## 2021-04-07 DIAGNOSIS — F909 Attention-deficit hyperactivity disorder, unspecified type: Secondary | ICD-10-CM | POA: Diagnosis present

## 2021-04-07 DIAGNOSIS — F988 Other specified behavioral and emotional disorders with onset usually occurring in childhood and adolescence: Secondary | ICD-10-CM | POA: Diagnosis present

## 2021-04-07 DIAGNOSIS — Z3A39 39 weeks gestation of pregnancy: Secondary | ICD-10-CM

## 2021-04-07 DIAGNOSIS — O99892 Other specified diseases and conditions complicating childbirth: Secondary | ICD-10-CM | POA: Diagnosis present

## 2021-04-07 DIAGNOSIS — Z8759 Personal history of other complications of pregnancy, childbirth and the puerperium: Secondary | ICD-10-CM

## 2021-04-07 DIAGNOSIS — F419 Anxiety disorder, unspecified: Secondary | ICD-10-CM | POA: Diagnosis present

## 2021-04-07 DIAGNOSIS — O3663X0 Maternal care for excessive fetal growth, third trimester, not applicable or unspecified: Secondary | ICD-10-CM | POA: Diagnosis not present

## 2021-04-07 DIAGNOSIS — Z3403 Encounter for supervision of normal first pregnancy, third trimester: Secondary | ICD-10-CM

## 2021-04-07 DIAGNOSIS — R002 Palpitations: Secondary | ICD-10-CM | POA: Diagnosis present

## 2021-04-07 DIAGNOSIS — O4202 Full-term premature rupture of membranes, onset of labor within 24 hours of rupture: Secondary | ICD-10-CM | POA: Diagnosis not present

## 2021-04-07 LAB — POCT FERN TEST: POCT Fern Test: POSITIVE

## 2021-04-07 LAB — TYPE AND SCREEN
ABO/RH(D): A POS
Antibody Screen: NEGATIVE

## 2021-04-07 LAB — CBC
HCT: 36.9 % (ref 36.0–46.0)
Hemoglobin: 12 g/dL (ref 12.0–15.0)
MCH: 29.2 pg (ref 26.0–34.0)
MCHC: 32.5 g/dL (ref 30.0–36.0)
MCV: 89.8 fL (ref 80.0–100.0)
Platelets: 257 10*3/uL (ref 150–400)
RBC: 4.11 MIL/uL (ref 3.87–5.11)
RDW: 13.8 % (ref 11.5–15.5)
WBC: 9 10*3/uL (ref 4.0–10.5)
nRBC: 0 % (ref 0.0–0.2)

## 2021-04-07 LAB — RESP PANEL BY RT-PCR (FLU A&B, COVID) ARPGX2
Influenza A by PCR: NEGATIVE
Influenza B by PCR: NEGATIVE
SARS Coronavirus 2 by RT PCR: NEGATIVE

## 2021-04-07 MED ORDER — MISOPROSTOL 50MCG HALF TABLET
50.0000 ug | ORAL_TABLET | ORAL | Status: DC
  Administered 2021-04-07: 50 ug via BUCCAL
  Filled 2021-04-07: qty 1

## 2021-04-07 MED ORDER — METOPROLOL TARTRATE 25 MG PO TABS
12.5000 mg | ORAL_TABLET | Freq: Two times a day (BID) | ORAL | Status: DC
  Administered 2021-04-07: 12.5 mg via ORAL
  Filled 2021-04-07 (×3): qty 0.5

## 2021-04-07 MED ORDER — PHENYLEPHRINE 40 MCG/ML (10ML) SYRINGE FOR IV PUSH (FOR BLOOD PRESSURE SUPPORT): 80.0000 ug | PREFILLED_SYRINGE | INTRAVENOUS | Status: DC | PRN

## 2021-04-07 MED ORDER — SERTRALINE HCL 50 MG PO TABS
150.0000 mg | ORAL_TABLET | Freq: Every day | ORAL | Status: DC
  Filled 2021-04-07: qty 1

## 2021-04-07 MED ORDER — SOD CITRATE-CITRIC ACID 500-334 MG/5ML PO SOLN
30.0000 mL | ORAL | Status: DC | PRN
  Filled 2021-04-07: qty 30

## 2021-04-07 MED ORDER — ACETAMINOPHEN 325 MG PO TABS: 650.0000 mg | ORAL_TABLET | ORAL | Status: DC | PRN

## 2021-04-07 MED ORDER — ONDANSETRON HCL 4 MG/2ML IJ SOLN
4.0000 mg | Freq: Four times a day (QID) | INTRAMUSCULAR | Status: DC | PRN
  Filled 2021-04-07: qty 2

## 2021-04-07 MED ORDER — DIPHENHYDRAMINE HCL 50 MG/ML IJ SOLN
12.5000 mg | INTRAMUSCULAR | Status: DC | PRN
  Filled 2021-04-07: qty 1

## 2021-04-07 MED ORDER — FENTANYL-BUPIVACAINE-NACL 0.5-0.125-0.9 MG/250ML-% EP SOLN
12.0000 mL/h | EPIDURAL | Status: DC | PRN
  Administered 2021-04-08: 12 mL/h via EPIDURAL
  Filled 2021-04-07: qty 250

## 2021-04-07 MED ORDER — OXYCODONE-ACETAMINOPHEN 5-325 MG PO TABS: 1.0000 | ORAL_TABLET | ORAL | Status: DC | PRN

## 2021-04-07 MED ORDER — FLEET ENEMA 7-19 GM/118ML RE ENEM: 1.0000 | ENEMA | RECTAL | Status: DC | PRN

## 2021-04-07 MED ORDER — LACTATED RINGERS IV SOLN
500.0000 mL | Freq: Once | INTRAVENOUS | Status: AC
  Administered 2021-04-07: 500 mL via INTRAVENOUS

## 2021-04-07 MED ORDER — TERBUTALINE SULFATE 1 MG/ML IJ SOLN
0.2500 mg | Freq: Once | INTRAMUSCULAR | Status: AC | PRN
  Administered 2021-04-08: 0.25 mg via SUBCUTANEOUS
  Filled 2021-04-07: qty 1

## 2021-04-07 MED ORDER — LIDOCAINE HCL (PF) 1 % IJ SOLN: 30.0000 mL | INTRAMUSCULAR | Status: DC | PRN

## 2021-04-07 MED ORDER — OXYTOCIN BOLUS FROM INFUSION
333.0000 mL | Freq: Once | INTRAVENOUS | Status: AC
  Administered 2021-04-08: 333 mL via INTRAVENOUS

## 2021-04-07 MED ORDER — LACTATED RINGERS IV SOLN
INTRAVENOUS | Status: DC
  Administered 2021-04-08: 125 mL/h via INTRAVENOUS

## 2021-04-07 MED ORDER — EPHEDRINE 5 MG/ML INJ: 10.0000 mg | INTRAVENOUS | Status: DC | PRN

## 2021-04-07 MED ORDER — OXYCODONE-ACETAMINOPHEN 5-325 MG PO TABS: 2.0000 | ORAL_TABLET | ORAL | Status: DC | PRN

## 2021-04-07 MED ORDER — OXYTOCIN-SODIUM CHLORIDE 30-0.9 UT/500ML-% IV SOLN
2.5000 [IU]/h | INTRAVENOUS | Status: DC
  Filled 2021-04-07: qty 500

## 2021-04-07 MED ORDER — OXYTOCIN-SODIUM CHLORIDE 30-0.9 UT/500ML-% IV SOLN
1.0000 m[IU]/min | INTRAVENOUS | Status: DC
Start: 2021-04-07 — End: 2021-04-08

## 2021-04-07 MED ORDER — TERBUTALINE SULFATE 1 MG/ML IJ SOLN
0.2500 mg | Freq: Once | INTRAMUSCULAR | Status: AC | PRN
  Administered 2021-04-08: 0.25 mg via SUBCUTANEOUS

## 2021-04-07 MED ORDER — LACTATED RINGERS IV SOLN
500.0000 mL | INTRAVENOUS | Status: DC | PRN
  Administered 2021-04-07: 500 mL via INTRAVENOUS

## 2021-04-07 NOTE — Progress Notes (Signed)
Patient Vitals for the past 4 hrs:  BP Temp Pulse Resp Height Weight  04/07/21 2343 129/72 -- 78 -- -- --  04/07/21 2302 (!) 99/55 98 F (36.7 C) 68 18 -- --  04/07/21 2156 108/74 -- 85 18 -- --  04/07/21 2055 106/66 97.8 F (36.6 C) 70 18 -- --  04/07/21 1959 -- -- -- -- 5\' 6"  (1.676 m) 105 kg  04/07/21 1953 113/77 -- 86 18 -- --   FHR 140s, moderate variability. Having intermittent prolonged decels, responds to position changes, IVF bolus.  Cooks catheter removed from vagina. CX 5/80/-2.  IUPC placed.  MVUs ~ 140.  After last prolonged decel, amnioinfusion started.  Dr. 06-20-2004 made aware; if decels continue after amnioinfusion, consider terbutaline. At any rate, will not augment until Cat 1 strip.

## 2021-04-07 NOTE — MAU Note (Signed)
Carol Clark is a 25 y.o. at [redacted]w[redacted]d here in MAU reporting: scheduled for IOL tomorrow. Today while sitting on the deck had a large gush of clear/pink fluid and is continuing to leak. Is currently wearing adult diaper. This happened around 107. Having some mild cramping. +FM  Onset of complaint: today  Pain score: 2/10  Vitals:   04/07/21 1247  BP: 114/82  Pulse: 75  Resp: 16  Temp: 98.5 F (36.9 C)  SpO2: 97%     FHT:124  Lab orders placed from triage: none

## 2021-04-07 NOTE — Progress Notes (Signed)
Vtx by [facility] 

## 2021-04-07 NOTE — H&P (Addendum)
OBSTETRIC ADMISSION HISTORY AND PHYSICAL  Carol Clark is a 25 y.o. female G1P0000 with IUP at [redacted]w[redacted]d by LMP presenting for PROM. She reports +FMs, no VB, no blurry vision, headaches or peripheral edema, and RUQ pain.  She plans on breast feeding. She request POPs for birth control. She received her prenatal care at  [facility]    Dating: By LMP --->  Estimated Date of Delivery: 04/15/21  Sono:    @[redacted]w[redacted]d , CWD, normal anatomy, cephalic presentation, posterior placental lie, 3229g, 93%ile EFW   Prenatal History/Complications:  Suspected Macrosomia ADD  Past Medical History: Past Medical History:  Diagnosis Date   ADHD    Anxiety     Past Surgical History: History reviewed. No pertinent surgical history.  Obstetrical History: OB History     Gravida  1   Para  0   Term  0   Preterm  0   AB  0   Living  0      SAB  0   IAB  0   Ectopic  0   Multiple  0   Live Births  0           Social History Social History   Socioeconomic History   Marital status: Married    Spouse name: Not on file   Number of children: Not on file   Years of education: Not on file   Highest education level: Bachelor's degree (e.g., BA, AB, BS)  Occupational History   Occupation: student  Tobacco Use   Smoking status: Never   Smokeless tobacco: Never  Vaping Use   Vaping Use: Never used  Substance and Sexual Activity   Alcohol use: Never   Drug use: Never   Sexual activity: Yes    Birth control/protection: None  Other Topics Concern   Not on file  Social History Narrative   Not on file   Social Determinants of Health   Financial Resource Strain: Low Risk    Difficulty of Paying Living Expenses: Not hard at all  Food Insecurity: No Food Insecurity   Worried About in the Last Year: Never true   Ran Out of Food in the Last Year: Never true  Transportation Needs: No Transportation Needs   Lack of Transportation (Medical): No   Lack of  Transportation (Non-Medical): No  Physical Activity: Insufficiently Active   Days of Exercise per Week: 2 days   Minutes of Exercise per Session: 10 min  Stress: Stress Concern Present   Feeling of Stress : To some extent  Social Connections: Programme researcher, broadcasting/film/video of Communication with Friends and Family: More than three times a week   Frequency of Social Gatherings with Friends and Family: Twice a week   Attends Religious Services: More than 4 times per year   Active Member of Press photographer or Organizations: Yes   Attends [employer]: More than 4 times per year   Marital Status: Married    Family History: Family History  Problem Relation Age of Onset   Hypertension Father    Hyperlipidemia Father    Fibromyalgia Mother    Hyperlipidemia Maternal Grandmother    Hyperlipidemia Maternal Grandfather    Depression Maternal Grandfather    COPD Paternal Grandmother        pancreatic   Heart disease Paternal Grandfather    Cancer Neg Hx    Diabetes Neg Hx     Allergies: No Known Allergies  Medications Prior to Admission  Medication Sig Dispense Refill Last Dose   ferrous sulfate 325 (65 FE) MG EC tablet Take 325 mg by mouth daily.   04/07/2021   hydrOXYzine (ATARAX/VISTARIL) 10 MG tablet Take 10 mg by mouth at bedtime as needed.   04/06/2021   Prenatal Multivit-Min-Fe-FA (PRE-NATAL PO) Take 1 tablet by mouth daily.   04/07/2021   sertraline (ZOLOFT) 50 MG tablet Take 3 tablets (150 mg total) by mouth daily. 90 tablet 3 04/07/2021   metoprolol tartrate (LOPRESSOR) 25 MG tablet Take 0.5 tablets (12.5 mg total) by mouth 2 (two) times daily as needed (For palpitations). (Patient taking differently: Take 12.5 mg by mouth 2 (two) times daily.) 180 tablet 3      Review of Systems   All systems reviewed and negative except as stated in HPI  Blood pressure 110/76, pulse 82, temperature 98.1 F (36.7 C), temperature source Oral, resp. rate 18, last menstrual period  07/09/2020, SpO2 97 %. General appearance: alert Lungs: clear to auscultation bilaterally Heart: regular rate and rhythm Abdomen: soft, non-tender; bowel sounds normal Extremities: Homans sign is negative, no sign of DVT Presentation: cephalic Fetal monitoringBaseline: 130 bpm, Variability: Good {> 6 bpm), Accelerations: Reactive, and Decelerations: Absent Uterine activity irregular   Cx 1/50/-2  Prenatal labs: ABO, Rh: --/--/A POS (12/01 1402) Antibody: NEG (12/01 1402) Rubella: 2.04 (05/19 1419) RPR: Non Reactive (09/12 0831)  HBsAg: NON REACTIVE (05/19 1419)  HIV: Non Reactive (09/12 0831)  GBS: Negative/-- (11/11 1058)  2 hr Glucola normal Genetic screening  negative Anatomy US normal  Prenatal Transfer Tool  Maternal Diabetes: No Genetic Screening: Normal Maternal Ultrasounds/Referrals: Normal Fetal Ultrasounds or other Referrals:  None Maternal Substance Abuse:  No Significant Maternal Medications:  Meds include: Zoloft, Hydroxyzine Significant Maternal Lab Results: Group B Strep negative  Results for orders placed or performed during the hospital encounter of 04/07/21 (from the past 24 hour(s))  Fern Test   Collection Time: 04/07/21  1:31 PM  Result Value Ref Range   POCT Fern Test Positive = ruptured amniotic membanes   CBC   Collection Time: 04/07/21  2:02 PM  Result Value Ref Range   WBC 9.0 4.0 - 10.5 K/uL   RBC 4.11 3.87 - 5.11 MIL/uL   Hemoglobin 12.0 12.0 - 15.0 g/dL   HCT 60.7 37.1 - 06.2 %   MCV 89.8 80.0 - 100.0 fL   MCH 29.2 26.0 - 34.0 pg   MCHC 32.5 30.0 - 36.0 g/dL   RDW 69.4 85.4 - 62.7 %   Platelets 257 150 - 400 K/uL   nRBC 0.0 0.0 - 0.2 %  Type and screen   Collection Time: 04/07/21  2:02 PM  Result Value Ref Range   ABO/RH(D) A POS    Antibody Screen NEG    Sample Expiration      04/10/2021,2359 Performed at [facility], [address], [place], [place] [address]   Resp Panel by RT-PCR (Flu A&B, Covid) Nasopharyngeal Swab    Collection Time: 04/07/21  2:05 PM   Specimen: Nasopharyngeal Swab; Nasopharyngeal(NP) swabs in vial transport medium  Result Value Ref Range   SARS Coronavirus 2 by RT PCR NEGATIVE NEGATIVE   Influenza A by PCR NEGATIVE NEGATIVE   Influenza B by PCR NEGATIVE NEGATIVE    Patient Active Problem List   Diagnosis Date Noted   [redacted] weeks gestation of pregnancy 04/07/2021   Palpitations 11/22/2020   Insomnia 11/22/2020   ADHD    Supervision of normal first pregnancy  09/23/2020   Anxiety 08/26/2019   ADD (attention deficit disorder) 01/27/2016    Assessment/Plan:  Carol Clark is a 25 y.o. G1P0000 at [redacted]w[redacted]d here for PROM at 11AM  #Labor: Check once settled in room. Was 1.5cm in clinic yesterday. Discussed induction methods with patient given ROM for >6hr without going into labor. Patient amenable to cytotec and foley bulb if appropriate. Given shift change and oncoming provider preference - check and next steps will be done by oncoming CNM Cresenzo-Dischmon.   #Pain: Plans epidural #FWB: Cat I  #ID:  GBS neg #MOF: breast #MOC:POPs #Circ:  N/A  #Hx of palpitations Had zio patch in 10/2020, per patient no events. Was started on BID PRN Metoprolol and patient takes every day twice a day and has been asymptomatic since she started doing that. - will order BID Metoprolol 25mg  while admitted - plan for cardiology follow up post partum  #Mood symptoms Patient on Zoloft 150mg  daily. Takes in morning - will continue while inpatient  , MD, MPH OB Fellow, Faculty Practice   7:00 PM Pt amenable to IOL.  Cooks catheter inserted and inflated w/80cc H20.  Will give buccal cytotoc. 

## 2021-04-08 ENCOUNTER — Encounter (HOSPITAL_COMMUNITY): Payer: Self-pay | Admitting: Obstetrics & Gynecology

## 2021-04-08 ENCOUNTER — Inpatient Hospital Stay (HOSPITAL_COMMUNITY): Payer: 59 | Admitting: Anesthesiology

## 2021-04-08 ENCOUNTER — Inpatient Hospital Stay (HOSPITAL_COMMUNITY): Payer: 59

## 2021-04-08 ENCOUNTER — Inpatient Hospital Stay (HOSPITAL_COMMUNITY): Admission: AD | Admit: 2021-04-08 | Payer: 59 | Source: Home / Self Care | Admitting: Obstetrics & Gynecology

## 2021-04-08 DIAGNOSIS — O4202 Full-term premature rupture of membranes, onset of labor within 24 hours of rupture: Secondary | ICD-10-CM

## 2021-04-08 DIAGNOSIS — Z3A38 38 weeks gestation of pregnancy: Secondary | ICD-10-CM

## 2021-04-08 DIAGNOSIS — O411290 Chorioamnionitis, unspecified trimester, not applicable or unspecified: Secondary | ICD-10-CM

## 2021-04-08 DIAGNOSIS — O3663X0 Maternal care for excessive fetal growth, third trimester, not applicable or unspecified: Secondary | ICD-10-CM

## 2021-04-08 DIAGNOSIS — Z3403 Encounter for supervision of normal first pregnancy, third trimester: Secondary | ICD-10-CM | POA: Diagnosis not present

## 2021-04-08 DIAGNOSIS — Z8759 Personal history of other complications of pregnancy, childbirth and the puerperium: Secondary | ICD-10-CM

## 2021-04-08 HISTORY — DX: Personal history of other complications of pregnancy, childbirth and the puerperium: Z87.59

## 2021-04-08 LAB — RPR: RPR Ser Ql: NONREACTIVE

## 2021-04-08 MED ORDER — DIPHENHYDRAMINE HCL 50 MG/ML IJ SOLN
12.5000 mg | Freq: Once | INTRAMUSCULAR | Status: AC
  Administered 2021-04-08: 12.5 mg via INTRAVENOUS

## 2021-04-08 MED ORDER — SENNOSIDES-DOCUSATE SODIUM 8.6-50 MG PO TABS
2.0000 | ORAL_TABLET | Freq: Every day | ORAL | Status: DC
  Administered 2021-04-09: 2 via ORAL
  Filled 2021-04-08: qty 2

## 2021-04-08 MED ORDER — DIPHENHYDRAMINE HCL 25 MG PO CAPS: 25.0000 mg | ORAL_CAPSULE | Freq: Four times a day (QID) | ORAL | Status: DC | PRN

## 2021-04-08 MED ORDER — OXYTOCIN-SODIUM CHLORIDE 30-0.9 UT/500ML-% IV SOLN
1.0000 m[IU]/min | INTRAVENOUS | Status: DC
  Administered 2021-04-08: 1 m[IU]/min via INTRAVENOUS

## 2021-04-08 MED ORDER — PRENATAL MULTIVITAMIN CH
1.0000 | ORAL_TABLET | Freq: Every day | ORAL | Status: DC
  Administered 2021-04-09: 1 via ORAL
  Filled 2021-04-08: qty 1

## 2021-04-08 MED ORDER — BENZOCAINE-MENTHOL 20-0.5 % EX AERO
1.0000 "application " | INHALATION_SPRAY | CUTANEOUS | Status: DC | PRN
  Administered 2021-04-09: 1 via TOPICAL
  Filled 2021-04-08: qty 56

## 2021-04-08 MED ORDER — ACETAMINOPHEN 325 MG PO TABS
650.0000 mg | ORAL_TABLET | ORAL | Status: DC | PRN
  Administered 2021-04-09: 650 mg via ORAL
  Filled 2021-04-08: qty 2

## 2021-04-08 MED ORDER — METHYLERGONOVINE MALEATE 0.2 MG/ML IJ SOLN
INTRAMUSCULAR | Status: AC
  Filled 2021-04-08: qty 1

## 2021-04-08 MED ORDER — LIDOCAINE HCL (PF) 1 % IJ SOLN
INTRAMUSCULAR | Status: DC | PRN
  Administered 2021-04-08: 5 mL via EPIDURAL

## 2021-04-08 MED ORDER — COCONUT OIL OIL: 1.0000 "application " | TOPICAL_OIL | Status: DC | PRN

## 2021-04-08 MED ORDER — TERBUTALINE SULFATE 1 MG/ML IJ SOLN
INTRAMUSCULAR | Status: AC
  Filled 2021-04-08: qty 1

## 2021-04-08 MED ORDER — ONDANSETRON HCL 4 MG/2ML IJ SOLN: 4.0000 mg | INTRAMUSCULAR | Status: DC | PRN

## 2021-04-08 MED ORDER — SIMETHICONE 80 MG PO CHEW: 80.0000 mg | CHEWABLE_TABLET | ORAL | Status: DC | PRN

## 2021-04-08 MED ORDER — DIBUCAINE (PERIANAL) 1 % EX OINT: 1.0000 "application " | TOPICAL_OINTMENT | CUTANEOUS | Status: DC | PRN

## 2021-04-08 MED ORDER — TETANUS-DIPHTH-ACELL PERTUSSIS 5-2.5-18.5 LF-MCG/0.5 IM SUSY: 0.5000 mL | PREFILLED_SYRINGE | Freq: Once | INTRAMUSCULAR | Status: DC

## 2021-04-08 MED ORDER — ONDANSETRON HCL 4 MG PO TABS: 4.0000 mg | ORAL_TABLET | ORAL | Status: DC | PRN

## 2021-04-08 MED ORDER — IBUPROFEN 600 MG PO TABS
600.0000 mg | ORAL_TABLET | Freq: Four times a day (QID) | ORAL | Status: DC
  Administered 2021-04-09 (×4): 600 mg via ORAL
  Filled 2021-04-08 (×4): qty 1

## 2021-04-08 MED ORDER — WITCH HAZEL-GLYCERIN EX PADS
1.0000 "application " | MEDICATED_PAD | CUTANEOUS | Status: DC | PRN
  Administered 2021-04-09: 1 via TOPICAL

## 2021-04-08 MED ORDER — LIDOCAINE-EPINEPHRINE (PF) 1.5 %-1:200000 IJ SOLN
INTRAMUSCULAR | Status: DC | PRN
  Administered 2021-04-08: 5 mL via EPIDURAL

## 2021-04-08 MED ORDER — METHYLERGONOVINE MALEATE 0.2 MG/ML IJ SOLN
0.2000 mg | Freq: Once | INTRAMUSCULAR | Status: AC
  Administered 2021-04-08: 0.2 mg via INTRAMUSCULAR

## 2021-04-08 NOTE — Progress Notes (Signed)
Carol Clark is a 25 y.o. G1P0000 at [redacted]w[redacted]d  admitted for PROM  Subjective: Feels comfortable  Objective: BP (!) 106/56   Pulse 75   Temp 99 F (37.2 C) (Oral)   Resp 16   Ht 5\' 6"  (1.676 m)   Wt 105 kg   LMP 07/09/2020 (Exact Date)   SpO2 100%   BMI 37.36 kg/m  No intake/output data recorded. Total I/O In: -  Out: 1100 [Urine:1100]  Patient currently Cat I tracing. Contracting on own q2-4 mins. However given floor acuity and team going back for cesarean section and previous prolonged deceleration will keep pitocin off at this time and reassess in 2-3 hours.    SVE:   Dilation: 7 Effacement (%): 90 Station: -2 Exam by:: [phone], RN   Andrey Campanile 04/08/2021, 9:48 AM

## 2021-04-08 NOTE — Anesthesia Preprocedure Evaluation (Signed)
Anesthesia Evaluation  Patient identified by MRN, date of birth, ID band Patient awake    Reviewed: Allergy & Precautions, H&P , NPO status , Patient's Chart, lab work & pertinent test results  History of Anesthesia Complications Negative for: history of anesthetic complications  Airway Mallampati: II  TM Distance: >3 FB Neck ROM: full    Dental no notable dental hx. (+) Teeth Intact   Pulmonary neg pulmonary ROS,    Pulmonary exam normal breath sounds clear to auscultation       Cardiovascular negative cardio ROS Normal cardiovascular exam Rhythm:regular Rate:Normal     Neuro/Psych PSYCHIATRIC DISORDERS Anxiety negative neurological ROS     GI/Hepatic negative GI ROS, Neg liver ROS,   Endo/Other  negative endocrine ROS  Renal/GU negative Renal ROS  negative genitourinary   Musculoskeletal   Abdominal (+) + obese,   Peds  (+) ATTENTION DEFICIT DISORDER WITHOUT HYPERACTIVITY and ADHD Hematology negative hematology ROS (+)   Anesthesia Other Findings   Reproductive/Obstetrics (+) Pregnancy                             Anesthesia Physical Anesthesia Plan  ASA: 2  Anesthesia Plan: Epidural   Post-op Pain Management:    Induction:   PONV Risk Score and Plan:   Airway Management Planned:   Additional Equipment:   Intra-op Plan:   Post-operative Plan:   Informed Consent: I have reviewed the patients History and Physical, chart, labs and discussed the procedure including the risks, benefits and alternatives for the proposed anesthesia with the patient or authorized representative who has indicated his/her understanding and acceptance.       Plan Discussed with:   Anesthesia Plan Comments:         Anesthesia Quick Evaluation

## 2021-04-08 NOTE — Discharge Summary (Signed)
Postpartum Discharge Summary    Patient Name: Carol Clark DOB: 1996/04/11 MRN: [id number]  Date of admission: 04/07/2021 Delivery date:04/08/2021  Delivering provider: Renard Matter  Date of discharge: 04/09/2021  Admitting diagnosis: [redacted] weeks gestation of pregnancy [Z3A.39] Intrauterine pregnancy: [redacted]w[redacted]d     Secondary diagnosis:  Principal Problem:   [redacted] weeks gestation of pregnancy Active Problems:   Anxiety   ADD (attention deficit disorder)   Supervision of normal first pregnancy   Vacuum-assisted vaginal delivery  Additional problems: None    Discharge diagnosis: Term Pregnancy Delivered                                              Post partum procedures: None Augmentation: Pitocin, Cytotec, and IP Foley Complications: None  Hospital course: Induction of Labor With Vaginal Delivery   25 y.o. yo G1P0000 at [redacted]w[redacted]d was admitted to the hospital 04/07/2021 for induction of labor.  Indication for induction: PROM.  Patient had an uncomplicated labor course as follows: Membrane Rupture Time/Date: 11:30 AM ,04/07/2021   Delivery Method:Vaginal, Vacuum (Extractor)  Episiotomy: None  Lacerations:  2nd degree;Perineal  Details of delivery can be found in separate delivery note.  Patient had a routine postpartum course. Patient is discharged home 04/09/21.  Newborn Data: Birth date:04/08/2021  Birth time:5:15 PM  Gender:Female  Living status:Living  Apgars:5 ,8  Weight:3700 g   Magnesium Sulfate received: No BMZ received: No Rhophylac:N/A MMR:Yes T-DaP:Given prenatally Flu: Given prenatally Transfusion:No  Physical exam  Vitals:   04/09/21 0004 04/09/21 0511 04/09/21 0850 04/09/21 1356  BP: 95/74 109/73 111/74 107/79  Pulse: (!) 113 96 100 100  Resp: $Remo'18 16 16 16  'NwwYE$ Temp: 98.2 F (36.8 C) 98 F (36.7 C) 97.8 F (36.6 C) 98.2 F (36.8 C)  TempSrc: Oral Oral Oral Oral  SpO2: 100% 100%  100%  Weight:      Height:       General: alert Lochia: appropriate Uterine  Fundus: firm Incision: N/A DVT Evaluation: No evidence of DVT seen on physical exam. Labs: Lab Results  Component Value Date   WBC 9.0 04/07/2021   HGB 12.0 04/07/2021   HCT 36.9 04/07/2021   MCV 89.8 04/07/2021   PLT 257 04/07/2021   No flowsheet data found. Edinburgh Score: Edinburgh Postnatal Depression Scale Screening Tool 04/09/2021  I have been able to laugh and see the funny side of things. 0  I have looked forward with enjoyment to things. 0  I have blamed myself unnecessarily when things went wrong. 1  I have been anxious or worried for no good reason. 2  I have felt scared or panicky for no good reason. 1  Things have been getting on top of me. 1  I have been so unhappy that I have had difficulty sleeping. 0  I have felt sad or miserable. 1  I have been so unhappy that I have been crying. 1  The thought of harming myself has occurred to me. 0  Edinburgh Postnatal Depression Scale Total 7     After visit meds:  Allergies as of 04/09/2021   No Known Allergies      Medication List     TAKE these medications    acetaminophen 325 MG tablet Commonly known as: Tylenol Take 2 tablets (650 mg total) by mouth every 4 (four) hours as needed (for pain scale <  4).   ferrous sulfate 325 (65 FE) MG EC tablet Take 325 mg by mouth daily.   hydrOXYzine 10 MG tablet Commonly known as: ATARAX Take 10 mg by mouth at bedtime as needed.   ibuprofen 600 MG tablet Commonly known as: ADVIL Take 1 tablet (600 mg total) by mouth every 6 (six) hours. Start taking on: April 10, 2021   metoprolol tartrate 25 MG tablet Commonly known as: LOPRESSOR Take 0.5 tablets (12.5 mg total) by mouth 2 (two) times daily as needed (For palpitations). What changed: when to take this   norethindrone 0.35 MG tablet Commonly known as: Ortho Micronor Take 1 tablet (0.35 mg total) by mouth daily.   PRE-NATAL PO Take 1 tablet by mouth daily.   sertraline 50 MG tablet Commonly known as:  ZOLOFT Take 3 tablets (150 mg total) by mouth daily.         Discharge home in stable condition Infant Feeding: Breast Infant Disposition:home with mother Discharge instruction: per After Visit Summary and Postpartum booklet. Activity: Advance as tolerated. Pelvic rest for 6 weeks.  Diet: routine diet Future Appointments:No future appointments. Follow up Visit: Message sent to [facility] by Dr. Cy Blamer on 04/08/2021  Please schedule this patient for a In person postpartum visit in 4 weeks with the following provider: Any provider. Additional Postpartum F/U: None   Low risk pregnancy complicated by:  None Delivery mode:  Vaginal, Vacuum Neurosurgeon)  Anticipated Birth Control:  POPs   Renard Matter, MD, MPH OB Fellow, Faculty Practice

## 2021-04-08 NOTE — Progress Notes (Signed)
Patient ID: Carol Clark, female   DOB: 31-Jul-1995, 25 y.o.   MRN: [id number]  Micah Flesher to the room to check cervix. Patient was comfortable. Patient has had a prolonged first stage. She was started on pitocin but overnight had to have pitocin stopped due to prolonged decelerations for which she received terbutaline. Since I came on shift at 0800 the patient has been off pitocin and had progressed from 7 to lip.  She received benadryl for a swollen anterior lip of the cervix.  There were intermittent variables to the 90s but they have returned to the 140s with good variability.   Cervical exam:  Dilation: 10 Dilation Complete Date: 04/08/21 Dilation Complete Time: 1602 Effacement (%): 90 Station: 0 Presentation: Vertex Exam by:: MD Erroll Wilbourne  EFM 140/mod/+ accels, + variable appearing decels.  Toco: q 7 minutes  I pushed with the patient for 20 minutes and noted progression to +1 to +2 station. I restarted the pitocin at 4 mu to help with contraction pattern. Of note this infant feel OP and this would potentially explain the prolonged labor.    At this point I discussed with the patient that we did not need to perform a vacuum but could if necessary for maternal or fetal indication.   Risks of vacuum assistance were discussed in detail, including but not limited to, bleeding, infection, damage to maternal tissues, fetal cephalohematoma, inability to effect vaginal delivery of the head or shoulder dystocia that cannot be resolved by established maneuvers and need for emergency cesarean section.    Pt and partner were given the opportunity to ask questions about the procedure.  Again at this point I do not think this is necessary and I wanted to assure the patient and support person were able to hear the information in a calm/controlled setting

## 2021-04-08 NOTE — Progress Notes (Signed)
Amnioinfusion stopped per MD Ephriam Jenkins at bedside

## 2021-04-08 NOTE — Progress Notes (Signed)
FHR Cat 1 for several hours.  CTX minimal. WIll start pitocin, 45mu/1mu.

## 2021-04-08 NOTE — Anesthesia Procedure Notes (Signed)
Epidural Patient location during procedure: OB Start time: 04/08/2021 12:01 AM End time: 04/08/2021 12:11 AM  Staffing Anesthesiologist: Leonides Grills, MD Performed: anesthesiologist   Preanesthetic Checklist Completed: patient identified, IV checked, site marked, risks and benefits discussed, monitors and equipment checked, pre-op evaluation and timeout performed  Epidural Patient position: sitting Prep: DuraPrep Patient monitoring: heart rate, cardiac monitor, continuous pulse ox and blood pressure Approach: midline Location: L4-L5 Injection technique: LOR air  Needle:  Needle type: Tuohy  Needle gauge: 17 G Needle length: 9 cm Needle insertion depth: 6 cm Catheter type: closed end flexible Catheter size: 19 Gauge Catheter at skin depth: 12 cm Test dose: negative and 1.5% lidocaine with Epi 1:200 K  Assessment Events: blood not aspirated, injection not painful, no injection resistance and negative IV test  Additional Notes Informed consent obtained prior to proceeding including risk of failure, 1% risk of PDPH, risk of minor discomfort and bruising. Discussed alternatives to epidural analgesia and patient desires to proceed.  Timeout performed pre-procedure verifying patient name, procedure, and platelet count.  Patient tolerated procedure well. Reason for block:procedure for pain

## 2021-04-08 NOTE — Progress Notes (Signed)
Patient Vitals for the past 4 hrs:  BP Temp Temp src Pulse Resp  04/08/21 0631 112/70 -- -- 77 --  04/08/21 0600 107/62 -- -- 75 --  04/08/21 0530 (!) 112/57 98.6 F (37 C) Oral 73 --  04/08/21 0500 (!) 99/59 -- -- 73 --  04/08/21 0430 97/61 -- -- 82 --  04/08/21 0400 (!) 95/51 -- -- 75 18  04/08/21 0335 (!) 94/50 -- -- 82 18  04/08/21 0300 (!) 88/50 97.9 F (36.6 C) Oral 73 18   Max MVUs ~ 150.  FHR had another prolonged decel that didn't respond to conservative measures, so pitocin was D/C'd (at 3 mu/min).  FHR now w/only earlies, moderate variability.  Ctx spaced out to q 5 -6 minutes.  Will rest for another hour, try to restart pitocin at that time. Amnioinfusion still going w/good return of fluid.

## 2021-04-09 MED ORDER — IBUPROFEN 600 MG PO TABS: 600.0000 mg | ORAL_TABLET | Freq: Four times a day (QID) | ORAL | 0 refills | Status: DC

## 2021-04-09 MED ORDER — NORETHINDRONE 0.35 MG PO TABS: 1.0000 | ORAL_TABLET | Freq: Every day | ORAL | 11 refills | Status: DC

## 2021-04-09 MED ORDER — ACETAMINOPHEN 325 MG PO TABS
650.0000 mg | ORAL_TABLET | ORAL | 0 refills | Status: DC | PRN
Start: 2021-04-09 — End: 2021-05-13

## 2021-04-09 NOTE — Lactation Note (Signed)
This note was copied from a baby's chart. Lactation Consultation Note  Patient Name: Carol Clark PIRJJ'O Date: 04/09/2021 Reason for consult: Follow-up assessment;Difficult latch;1st time breastfeeding;Primapara;Term;Nipple pain/trauma Age:27 hours  LC in to visit with P1 Mom of term baby. Baby at 3.5% weight loss. Baby hasn't been too interested in latching and breastfeeding.  Baby's longest feeding was 7 minutes. Baby has been gassy, burping and a little gaggy.  Baby was a vacuum extracted vaginal delivery and needing resuscitation at birth which including some deep suctioning.  Baby sleeping but Mom wanting lactation assistance.  Removed swaddle and baby started awakening.  On oral assessment, baby noted to have a highly arched upper palate and a labial frenulum to gum line.  Baby sucked on gloved finger with good suction and good tongue cupping.  Tongue noted to elevate when crying and unable to identify a lingual frenulum.  Reviewed breast massage and hand expression, drop of colostrum noted and recommended it dabbed onto nipple for soreness.  Baby able to attain a deep latch to the breast with assistance and after a couple attempts.  Lots of basic teaching done.  Baby fed well for 15 mins with deep jaw extensions and pauses.  Baby came off, nipple slightly pinched, but pulled out.  Baby cueing after burping.  Mom latched baby in cross cradle hold on 2nd breast for 10 mins.   Baby cueing again and LC watched Mom latch in football hold.  Mom needing reminders to relax and lower her shoulders.  Baby relaxed on the breast and consistently sucking with pauses and deep jaw extensions.   Mom has a double pump at home, not a known brand.  Offered a STORK pump, but it became too late for Mom to obtain one.  Parents really want to go home tonight.  They have the hand pump to take home.  Mom will check with insurance about obtaining a better pump.   Engorgement prevention and treatment  reviewed. Encouraged STS and offering the breast with cues.    Mom aware of OP lactation support and encouraged to call prn for concerns.  Mom desires OP lactation F/U, message sent.   LATCH Score Latch: Grasps breast easily, tongue down, lips flanged, rhythmical sucking.  Audible Swallowing: Spontaneous and intermittent  Type of Nipple: Everted at rest and after stimulation  Comfort (Breast/Nipple): Filling, red/small blisters or bruises, mild/mod discomfort  Hold (Positioning): No assistance needed to correctly position infant at breast.  LATCH Score: 9   Lactation Tools Discussed/Used Tools: Pump;Flanges;Shells Flange Size: 24 Breast pump type: Manual Reason for Pumping: pre-pump to evert nipples  Interventions Interventions: Breast feeding basics reviewed;Assisted with latch;Skin to skin;Breast massage;Hand express;Pre-pump if needed;Breast compression;Adjust position;Support pillows;Position options;Hand pump;Education  Discharge Discharge Education: Engorgement and breast care;Outpatient recommendation Pump: Stork Pump  Consult Status Consult Status: Follow-up Date: 04/10/21 Follow-up type: In-patient    Carol Clark 04/09/2021, 5:19 PM

## 2021-04-09 NOTE — Anesthesia Postprocedure Evaluation (Signed)
Anesthesia Post Note  Patient: Carol Clark  Procedure(s) Performed: AN AD HOC LABOR EPIDURAL     Patient location during evaluation: Mother Baby Anesthesia Type: Epidural Level of consciousness: awake and alert, oriented and patient cooperative Pain management: pain level controlled Vital Signs Assessment: post-procedure vital signs reviewed and stable Respiratory status: spontaneous breathing Cardiovascular status: stable Postop Assessment: no headache, epidural receding, patient able to bend at knees and no signs of nausea or vomiting Anesthetic complications: no Comments: Pt. States she is walking. Pain score 3.    No notable events documented.  Last Vitals:  Vitals:   04/09/21 0004 04/09/21 0511  BP: 95/74 109/73  Pulse: (!) 113 96  Resp: 18 16  Temp: 36.8 C 36.7 C  SpO2: 100% 100%    Last Pain:  Vitals:   04/09/21 0700  TempSrc:   PainSc: 0-No pain   Pain Goal:                   [facility]

## 2021-04-09 NOTE — Progress Notes (Signed)
Post Partum Day 1 Subjective: Doing well. No acute events overnight. Pain is controlled and bleeding is appropriate. She is eating, drinking, voiding, and ambulating without issue. She is breast feeding which is going well. She has no other concerns at this time.  Objective: Blood pressure 109/73, pulse 96, temperature 98 F (36.7 C), temperature source Oral, resp. rate 16, height 5\' 6"  (1.676 m), weight 105 kg, last menstrual period 07/09/2020, SpO2 100 %, unknown if currently breastfeeding.  Physical Exam:  General: alert, cooperative, and no distress Lochia: appropriate Uterine Fundus: firm and below umbilicus  DVT Evaluation: no LE edema or calf tenderness to palpation  Recent Labs    04/07/21 1402  HGB 12.0  HCT 36.9    Assessment/Plan: Aviv Rota is a 25 y.o. G1P1001 on PPD# 1 s/p VAVD.  Progressing well. Meeting postpartum milestones. VSS. Continue routine postpartum care.  Feeding: Breast Contraception: POPs  Dispo: Plan for discharge on PPD#2.    LOS: 2 days   34, MD  04/09/2021, 9:22 AM

## 2021-04-09 NOTE — Lactation Note (Addendum)
This note was copied from a baby's chart. Lactation Consultation Note  Patient Name: Carol Clark Date: 04/09/2021 Reason for consult: Initial assessment Age:27 hours P1, term female infant. Mom requested LC services tonight  which was confirmed by RN Nehemiah Settle).  Per mom, infant has had 5 episodes of emesis ( amniotic fluid looks like coffee grinds).  Mom pre-pumped with hand pump prior to attempting to latch infant on her left breast using the football hold position. Infant did not latch at this time, mom was taught hand expression with breast module and mom self expressed 4 mls of colostrum that was spoon fed to infant. Infant was gaggy and LC burped and suction infant. Mom knows to breastfeed infant according to feeding cues, 8 to 12+ or more times within 24 hours, skin to skin. Mom knows if infant doesn't latch to hand express and give infant back EBM by spoon. Mom will continue to work towards latching infant at the breast and will ask for latch assistance if needed. Mom shown how to use hand pump & how to disassemble, clean, & reassemble parts.  Mom made aware of O/P services, breastfeeding support groups, community resources, and our phone # for post-discharge questions.   Maternal Data Has patient been taught Hand Expression?: Yes Does the patient have breastfeeding experience prior to this delivery?: No  Feeding Mother's Current Feeding Choice: Breast Milk  LATCH Score Latch: Too sleepy or reluctant, no latch achieved, no sucking elicited.  Audible Swallowing: None  Type of Nipple: Flat  Comfort (Breast/Nipple): Soft / non-tender  Hold (Positioning): Assistance needed to correctly position infant at breast and maintain latch.  LATCH Score: 4   Lactation Tools Discussed/Used Tools: Pump Breast pump type: Manual Pump Education: Setup, frequency, and cleaning;Milk Storage Reason for Pumping: Mom has flat nipples and was given hand pump to pre0pump breast  prior to latching infant. Pumping frequency: pre-pump prior to latching infant at the breast  Interventions Interventions: Breast feeding basics reviewed;Assisted with latch;Skin to skin;Hand express;Pre-pump if needed;Breast compression;Adjust position;Support pillows;Position options;Expressed milk;Hand pump;Education;LC Services brochure  Discharge Pump: Manual WIC Program: No  Consult Status Consult Status: Follow-up Date: 04/09/21 Follow-up type: In-patient    Danelle Earthly 04/09/2021, 12:54 AM

## 2021-04-09 NOTE — Progress Notes (Signed)
[name] was referred for history of depression/anxiety. * Referral screened out by Clinical Social Worker because none of the following criteria appear to apply:  ~ History of anxiety/depression during this pregnancy, or of post-partum depression following prior delivery. ~ Diagnosis of anxiety and/or depression within last 3 years OR * [name]'s symptoms currently being treated with medication and/or therapy. Per chart [name]'s symptoms are currently being treated with Zoloft and Vistaril.  Please contact the Clinical Social Worker if needs arise, by [facility] request, or if [name] scores greater than 9 or yes to question 10 on Edinburgh Postpartum Depression Screen.   Dolores Frame, MSW, LCSW-A Clinical Social Worker- Weekends [phone]

## 2021-04-11 ENCOUNTER — Ambulatory Visit (INDEPENDENT_AMBULATORY_CARE_PROVIDER_SITE_OTHER): Payer: 59 | Admitting: Obstetrics & Gynecology

## 2021-04-11 ENCOUNTER — Encounter (HOSPITAL_BASED_OUTPATIENT_CLINIC_OR_DEPARTMENT_OTHER): Payer: Self-pay | Admitting: Obstetrics & Gynecology

## 2021-04-11 ENCOUNTER — Other Ambulatory Visit: Payer: Self-pay

## 2021-04-11 ENCOUNTER — Encounter (HOSPITAL_BASED_OUTPATIENT_CLINIC_OR_DEPARTMENT_OTHER): Payer: 59 | Admitting: Obstetrics & Gynecology

## 2021-04-11 ENCOUNTER — Telehealth (HOSPITAL_BASED_OUTPATIENT_CLINIC_OR_DEPARTMENT_OTHER): Payer: Self-pay | Admitting: Obstetrics & Gynecology

## 2021-04-11 VITALS — BP 127/77 | HR 76 | Ht 66.0 in | Wt 225.2 lb

## 2021-04-11 DIAGNOSIS — R600 Localized edema: Secondary | ICD-10-CM

## 2021-04-11 DIAGNOSIS — K649 Unspecified hemorrhoids: Secondary | ICD-10-CM

## 2021-04-11 MED ORDER — HYDROCORTISONE (PERIANAL) 2.5 % EX CREA: TOPICAL_CREAM | Freq: Two times a day (BID) | CUTANEOUS | 1 refills | Status: DC

## 2021-04-11 MED ORDER — FUROSEMIDE 20 MG PO TABS: ORAL_TABLET | ORAL | 0 refills | Status: DC

## 2021-04-11 MED ORDER — HYDROCORTISONE ACETATE 25 MG RE SUPP: 25.0000 mg | Freq: Two times a day (BID) | RECTAL | 1 refills | Status: DC

## 2021-04-11 NOTE — Progress Notes (Signed)
GYNECOLOGY  VISIT  CC:   post partum LE edema  HPI: 25 y.o. G55P1001 Married White or Caucasian female here for complaint of lower extremity edema.  She delivered on Friday and went home on Saturday.  Is doing really well.  Is having some trouble with breast feeding.  Did bottle feed last night and she and spouse rested.  Pt is accompanied by her mother today.  Pt denies visual changes or headache.  Just worried about her LE edema.  Was more significant yesterday and today.  Decided to call and be seen.  Denies pain in feet or legs.    She does have some questions about hemorrhoids.  She is using sitz baths and witch hazel.  Has an area that is uncomfortable.  Would like some other recommendations.   Patient Active Problem List   Diagnosis Date Noted   Vacuum-assisted vaginal delivery 04/08/2021   [redacted] weeks gestation of pregnancy 04/07/2021   Palpitations 11/22/2020   Insomnia 11/22/2020   ADHD    Supervision of normal first pregnancy 09/23/2020   Anxiety 08/26/2019   ADD (attention deficit disorder) 01/27/2016    Past Medical History:  Diagnosis Date   ADHD    Anxiety     No past surgical history on file.  MEDS:   Current Outpatient Medications on File Prior to Visit  Medication Sig Dispense Refill   acetaminophen (TYLENOL) 325 MG tablet Take 2 tablets (650 mg total) by mouth every 4 (four) hours as needed (for pain scale < 4). 60 tablet 0   ferrous sulfate 325 (65 FE) MG EC tablet Take 325 mg by mouth daily.     hydrOXYzine (ATARAX/VISTARIL) 10 MG tablet Take 10 mg by mouth at bedtime as needed.     ibuprofen (ADVIL) 600 MG tablet Take 1 tablet (600 mg total) by mouth every 6 (six) hours. 30 tablet 0   metoprolol tartrate (LOPRESSOR) 25 MG tablet Take 0.5 tablets (12.5 mg total) by mouth 2 (two) times daily as needed (For palpitations). (Patient taking differently: Take 12.5 mg by mouth 2 (two) times daily.) 180 tablet 3   norethindrone (ORTHO MICRONOR) 0.35 MG tablet Take 1  tablet (0.35 mg total) by mouth daily. 28 tablet 11   Prenatal Multivit-Min-Fe-FA (PRE-NATAL PO) Take 1 tablet by mouth daily.     sertraline (ZOLOFT) 50 MG tablet Take 3 tablets (150 mg total) by mouth daily. 90 tablet 3   No current facility-administered medications on file prior to visit.    ALLERGIES: Patient has no known allergies.  Family History  Problem Relation Age of Onset   Hypertension Father    Hyperlipidemia Father    Fibromyalgia Mother    Hyperlipidemia Maternal Grandmother    Hyperlipidemia Maternal Grandfather    Depression Maternal Grandfather    COPD Paternal Grandmother        pancreatic   Heart disease Paternal Grandfather    Cancer Neg Hx    Diabetes Neg Hx     SH:  married, non smoker  Review of Systems  Constitutional: Negative.   Respiratory: Negative.    Cardiovascular: Negative.    PHYSICAL EXAMINATION:    BP 127/77 (BP Location: Right Arm, Patient Position: Sitting, Cuff Size: Large)   Pulse 76   Ht 5\' 6"  (1.676 m) Comment: reported  Wt 225 lb 3.2 oz (102.2 kg)   LMP 07/09/2020 (Exact Date)   BMI 36.35 kg/m     General appearance: alert, cooperative and appears stated age CV:  Regular rate and rhythm Lungs:  clear to auscultation, no wheezes, rales or rhonchi, symmetric air entry Extremities:  Bilateral and symmetric LE, +2 edema up to mid calves, no erythema, no tenderness to palpation of calves   Assessment/Plan: 1. Lower extremity edema - furosemide (LASIX) 20 MG tablet; 1 tablet daily x 5 days  Dispense: 5 tablet; Refill: 0  2. Hemorrhoids, unspecified hemorrhoid type - hydrocortisone (ANUSOL-HC) 2.5 % rectal cream; Place rectally 2 (two) times daily.  Dispense: 30 g; Refill: 1 - rx for hydrocortisone suppositories also sent to pharmacy as well.

## 2021-04-11 NOTE — Telephone Encounter (Signed)
Called patient and left a message to please call the office to set up appointment for  postpartum visit in 4 weeks.

## 2021-04-12 ENCOUNTER — Other Ambulatory Visit (HOSPITAL_BASED_OUTPATIENT_CLINIC_OR_DEPARTMENT_OTHER): Payer: Self-pay

## 2021-04-12 ENCOUNTER — Encounter (HOSPITAL_BASED_OUTPATIENT_CLINIC_OR_DEPARTMENT_OTHER): Payer: Self-pay | Admitting: Obstetrics & Gynecology

## 2021-04-12 LAB — SURGICAL PATHOLOGY

## 2021-04-12 MED ORDER — HYDROCORTISONE ACETATE 25 MG RE SUPP: 25.0000 mg | Freq: Two times a day (BID) | RECTAL | 1 refills | Status: DC

## 2021-04-21 ENCOUNTER — Telehealth (HOSPITAL_COMMUNITY): Payer: Self-pay | Admitting: *Deleted

## 2021-04-21 NOTE — Telephone Encounter (Signed)
Hospital Discharge Follow-Up Call:  Patient reports that she is well and has no concerns about her healing process.  EPDS today is 5 and she endorses this accurately reflects that she is doing well emotionally.  Patient says that baby is well and she has no concerns about baby's health.  She reports that baby sleeps in a crib and sometime in a bouncy seat during the day.  Reviewed ABCs of Safe Sleep.

## 2021-05-10 ENCOUNTER — Encounter (HOSPITAL_BASED_OUTPATIENT_CLINIC_OR_DEPARTMENT_OTHER): Payer: Self-pay | Admitting: *Deleted

## 2021-05-13 ENCOUNTER — Encounter (HOSPITAL_BASED_OUTPATIENT_CLINIC_OR_DEPARTMENT_OTHER): Payer: Self-pay | Admitting: Medical

## 2021-05-13 ENCOUNTER — Other Ambulatory Visit: Payer: Self-pay

## 2021-05-13 ENCOUNTER — Ambulatory Visit (INDEPENDENT_AMBULATORY_CARE_PROVIDER_SITE_OTHER): Payer: 59 | Admitting: Medical

## 2021-05-13 DIAGNOSIS — Z8759 Personal history of other complications of pregnancy, childbirth and the puerperium: Secondary | ICD-10-CM | POA: Diagnosis not present

## 2021-05-13 DIAGNOSIS — R87610 Atypical squamous cells of undetermined significance on cytologic smear of cervix (ASC-US): Secondary | ICD-10-CM | POA: Diagnosis not present

## 2021-05-13 DIAGNOSIS — Z3009 Encounter for other general counseling and advice on contraception: Secondary | ICD-10-CM

## 2021-05-13 MED ORDER — NORGESTIMATE-ETH ESTRADIOL 0.25-35 MG-MCG PO TABS
1.0000 | ORAL_TABLET | Freq: Every day | ORAL | 11 refills | Status: DC
Start: 2021-05-13 — End: 2022-06-05

## 2021-05-13 NOTE — Progress Notes (Signed)
[place] Partum Visit Note  Carol Clark is a 26 y.o. G40P1001 female who presents for a postpartum visit. She is 4 weeks postpartum following a vacuum-assisted vaginal delivery.  I have fully reviewed the prenatal and intrapartum course. The delivery was at 38.6 gestational weeks.  Anesthesia: epidural. Postpartum course has been uncomplicated. Baby is doing well. Baby is feeding by bottle - Enfamil Neuropro . Bleeding no bleeding. Bowel function is normal. Bladder function is normal. Patient is not sexually active. Contraception method is abstinence. Postpartum depression screening: negative.   Upstream - 05/13/21 1131       Pregnancy Intention Screening   Does the patient want to become pregnant in the next year? No    Does the patient's partner want to become pregnant in the next year? No    Would the patient like to discuss contraceptive options today? Yes      Contraception Wrap Up   Current Method Abstinence    End Method Oral Contraceptive    Contraception Counseling Provided Yes            The pregnancy intention screening data noted above was reviewed. Potential methods of contraception were discussed. The patient elected to proceed with Oral Contraceptive.   Edinburgh Postnatal Depression Scale - 05/13/21 1120       Edinburgh Postnatal Depression Scale:  In the Past 7 Days   I have been able to laugh and see the funny side of things. 0    I have looked forward with enjoyment to things. 0    I have blamed myself unnecessarily when things went wrong. 1    I have been anxious or worried for no good reason. 1    I have felt scared or panicky for no good reason. 1    Things have been getting on top of me. 1    I have been so unhappy that I have had difficulty sleeping. 0    I have felt sad or miserable. 1    I have been so unhappy that I have been crying. 0    The thought of harming myself has occurred to me. 0    Edinburgh Postnatal Depression Scale Total 5              Health Maintenance Due  Topic Date Due   COVID-19 Vaccine (4 - Booster for Pfizer series) 05/14/2020    The following portions of the patient's history were reviewed and updated as appropriate: allergies, current medications, past family history, past medical history, past social history, past surgical history, and problem list.  Review of Systems Pertinent items are noted in HPI.  Objective:  BP 121/83    Pulse 89    Ht 5\' 6"  (1.676 m)    Wt 208 lb 9.6 oz (94.6 kg)    LMP 07/09/2020 (Exact Date)    BMI 33.67 kg/m    General:  alert and cooperative   Breasts:  not indicated  Lungs: Normal effort  Heart:  Normal pulse  Abdomen: Soft, non-tender    Wound N/A  GU exam:   Perineal tear is well-healed, stitches are no longer present, no bleeding noted. No masses or tenderness of the adnexa. Uterus still slightly enlarged, but non-tender and firm.        Assessment:    1. Routine postpartum follow-up  2. Status post vacuum-assisted vaginal delivery  Normal postpartum exam.   Plan:   Essential components of care per ACOG recommendations:  1.  Mood and well being: Patient with negative depression screening today. Reviewed local resources for support.  - Patient tobacco use? No.   - hx of drug use? No.    2. Infant care and feeding:  -Patient currently breastmilk feeding? No.  -Social determinants of health (SDOH) reviewed in EPIC. No concerns.   3. Sexuality, contraception and birth spacing - Patient does not want a pregnancy in the next year.  Desired family size is 2 children.  - Reviewed forms of contraception in tiered fashion. Patient desired oral contraceptives (estrogen/progesterone) today.   - Discussed birth spacing of 18 months  4. Sleep and fatigue -Encouraged family/partner/community support of 4 hrs of uninterrupted sleep to help with mood and fatigue  5. Physical Recovery  - Discussed patients delivery and complications. She describes her labor as  good. - Patient had a  VAVD . Patient had a 2nd degree laceration. Perineal healing reviewed. Patient expressed understanding - Patient has urinary incontinence? No. - Patient is safe to resume physical and sexual activity  6.  Health Maintenance - HM due items addressed Yes - Last pap smear  Diagnosis  Date Value Ref Range Status  05/19/2020 (A)  Final   - Atypical squamous cells of undetermined significance (ASC-US)   Pap smear not done at today's visit. Per ASCCP guidelines, routine screening, next pap smear due 05/2023 -Breast Cancer screening indicated? No.   7. Chronic Disease/Pregnancy Condition follow up: None   Kerry Hough, PA-C [facility] [employer] [facility], [place]

## 2021-05-30 ENCOUNTER — Encounter (HOSPITAL_BASED_OUTPATIENT_CLINIC_OR_DEPARTMENT_OTHER): Payer: Self-pay | Admitting: Obstetrics & Gynecology

## 2021-06-03 ENCOUNTER — Other Ambulatory Visit (HOSPITAL_BASED_OUTPATIENT_CLINIC_OR_DEPARTMENT_OTHER): Payer: Self-pay | Admitting: *Deleted

## 2021-06-03 DIAGNOSIS — F419 Anxiety disorder, unspecified: Secondary | ICD-10-CM

## 2021-06-03 MED ORDER — SERTRALINE HCL 100 MG PO TABS: 150.0000 mg | ORAL_TABLET | Freq: Every day | ORAL | 2 refills | Status: DC

## 2021-06-03 NOTE — Progress Notes (Signed)
Per pt her insurance will cover zoloft written as 100 mg tablet and 50mg  tablet. Or 100mg  1.5 tablets. New Rx sent.

## 2021-06-20 ENCOUNTER — Encounter (HOSPITAL_BASED_OUTPATIENT_CLINIC_OR_DEPARTMENT_OTHER): Payer: Self-pay | Admitting: Obstetrics & Gynecology

## 2021-07-05 ENCOUNTER — Other Ambulatory Visit: Payer: Self-pay

## 2021-07-05 ENCOUNTER — Encounter (HOSPITAL_BASED_OUTPATIENT_CLINIC_OR_DEPARTMENT_OTHER): Payer: Self-pay | Admitting: Nurse Practitioner

## 2021-07-05 ENCOUNTER — Ambulatory Visit (INDEPENDENT_AMBULATORY_CARE_PROVIDER_SITE_OTHER): Payer: 59 | Admitting: Nurse Practitioner

## 2021-07-05 VITALS — HR 68 | Ht 66.0 in | Wt 201.6 lb

## 2021-07-05 DIAGNOSIS — F909 Attention-deficit hyperactivity disorder, unspecified type: Secondary | ICD-10-CM

## 2021-07-05 DIAGNOSIS — D509 Iron deficiency anemia, unspecified: Secondary | ICD-10-CM

## 2021-07-05 DIAGNOSIS — E559 Vitamin D deficiency, unspecified: Secondary | ICD-10-CM

## 2021-07-05 DIAGNOSIS — Z0000 Encounter for general adult medical examination without abnormal findings: Secondary | ICD-10-CM

## 2021-07-05 DIAGNOSIS — F418 Other specified anxiety disorders: Secondary | ICD-10-CM

## 2021-07-05 MED ORDER — LISDEXAMFETAMINE DIMESYLATE 20 MG PO CAPS: 20.0000 mg | ORAL_CAPSULE | Freq: Every day | ORAL | 0 refills | Status: DC

## 2021-07-05 MED ORDER — VITAMIN D (ERGOCALCIFEROL) 1.25 MG (50000 UNIT) PO CAPS: 50000.0000 [IU] | ORAL_CAPSULE | ORAL | 0 refills | Status: DC

## 2021-07-05 NOTE — Progress Notes (Signed)
Orma Render, DNP, AGNP-c [facility] [address]   [place], [place] [address] [phone]  New patient visit   Patient: Carol Clark   DOB: May 25, 1995   26 y.o. Female  MRN: [id number] Visit Date: 07/05/2021  Patient Care Team: Geanie Pacifico, Coralee Pesa, NP as PCP - General (Nurse Practitioner)  Today's healthcare provider: Orma Render, NP   Chief Complaint  Patient presents with   New Patient (Initial Visit)    Patient presents today to establish care and discuss ADHD, she has taken Vyvanse in the past and has worked very well for her. ( She cant remember the strength).  No vitals taken as she was feeding the baby.    Subjective    Carol Clark is a 26 y.o. female who presents today as a new patient to establish care.    Patient endorses the following concerns presently: ADHD Longstanding history of ADHD. She was previously on adderall and vyvanse for treatment. Adderall caused palpitations and increased anxiety symptoms. Vyvanse was well tolerated. She stopped this while pregnant, but is now having difficulty with focus, concentration, and completing tasks. She is not breast feeding. She would like to restart her medication with the Vyvanse if possible.   Vitamin D Recent labs showed Vitamin D deficiency of 18. She was not started on any medications for this or replacement. She does endorse fatigue. She is not breast feeding at this time.   Anxiety/Depression Currently on sertraline 164m daily and tolerating well. Changes in mood associated with recent pregnancy and new baby, but not overwhelmed at this time.   Recent labs performed 1 month ago with outside clinic. Will request records for this for review.   History reviewed and reveals the following: Past Medical History:  Diagnosis Date   ADHD    Anxiety    Insomnia 11/22/2020   Palpitations 11/22/2020   Status post vacuum-assisted vaginal delivery 04/08/2021   Delivery  04/08/21   History reviewed. No pertinent surgical history. Family Status  Relation Name Status   Father  Alive   Mother  Alive   Brother  Alive   MGM  Alive   MGF  Deceased   PGM  Deceased   PGF  Deceased   Neg Hx  (Not Specified)   Family History  Problem Relation Age of Onset   Hypertension Father    Hyperlipidemia Father    Fibromyalgia Mother    Hyperlipidemia Maternal Grandmother    Hyperlipidemia Maternal Grandfather    Depression Maternal Grandfather    COPD Paternal Grandmother        pancreatic   Heart disease Paternal Grandfather    Cancer Neg Hx    Diabetes Neg Hx    Social History   Socioeconomic History   Marital status: Married    Spouse name: Not on file   Number of children: Not on file   Years of education: Not on file   Highest education level: Bachelor's degree (e.g., BA, AB, BS)  Occupational History   Occupation: student  Tobacco Use   Smoking status: Never   Smokeless tobacco: Never  Vaping Use   Vaping Use: Never used  Substance and Sexual Activity   Alcohol use: Never   Drug use: Never   Sexual activity: Yes    Birth control/protection: None  Other Topics Concern   Not on file  Social History Narrative   Not on file   Social Determinants of Health  Financial Resource Strain: Low Risk    Difficulty of Paying Living Expenses: Not hard at all  Food Insecurity: No Food Insecurity   Worried About Charity fundraiser in the Last Year: Never true   Ran Out of Food in the Last Year: Never true  Transportation Needs: No Transportation Needs   Lack of Transportation (Medical): No   Lack of Transportation (Non-Medical): No  Physical Activity: Insufficiently Active   Days of Exercise per Week: 2 days   Minutes of Exercise per Session: 10 min  Stress: Stress Concern Present   Feeling of Stress : To some extent  Social Connections: Engineer, building services of Communication with Friends and Family: More than three times a week    Frequency of Social Gatherings with Friends and Family: Twice a week   Attends Religious Services: More than 4 times per year   Active Member of [employer] or Organizations: Yes   Attends Music therapist: More than 4 times per year   Marital Status: Married   Outpatient Medications Prior to Visit  Medication Sig   ferrous sulfate 325 (65 FE) MG EC tablet Take 325 mg by mouth daily.   metoprolol tartrate (LOPRESSOR) 25 MG tablet Take 12.5 mg by mouth 2 (two) times daily as needed.   norgestimate-ethinyl estradiol (ORTHO-CYCLEN) 0.25-35 MG-MCG tablet Take 1 tablet by mouth daily.   sertraline (ZOLOFT) 100 MG tablet Take 1.5 tablets (150 mg total) by mouth daily.   [DISCONTINUED] hydrocortisone (ANUSOL-HC) 2.5 % rectal cream Place rectally 2 (two) times daily.   [DISCONTINUED] hydrOXYzine (ATARAX/VISTARIL) 10 MG tablet Take 10 mg by mouth at bedtime as needed.   [DISCONTINUED] norethindrone (ORTHO MICRONOR) 0.35 MG tablet Take 1 tablet (0.35 mg total) by mouth daily.   [DISCONTINUED] Prenatal Multivit-Min-Fe-FA (PRE-NATAL PO) Take 1 tablet by mouth daily.   No facility-administered medications prior to visit.   No Known Allergies Immunization History  Administered Date(s) Administered   DTaP 12/31/1998   Hepatitis A 07/06/2010, 01/05/2011   Hepatitis A, Ped/Adol-2 Dose 07/06/2010, 01/05/2011   Hepatitis B 07/06/2010, 08/19/2010, 12/15/2010   IPV 12/31/1998, 12/15/2010, 02/23/2011   Influenza Split 02/17/2016, 01/13/2020   Influenza,inj,Quad PF,6+ Mos 01/24/2018   Influenza-Unspecified 04/21/2014, 02/17/2016, 02/11/2021   MMR 01/10/1995, 12/31/1998   Meningococcal Conjugate 01/05/2011, 01/05/2011   PFIZER(Purple Top)SARS-COV-2 Vaccination 05/13/2019, 06/03/2019, 03/19/2020   Td 11/18/2016   Tdap 12/31/1998, 07/06/2010, 05/08/2016, 01/17/2021   Typhoid Inactivated 09/22/2014   Varicella 12/31/1998, 08/19/2010    Health Maintenance Due: Health Maintenance  Topic Date Due    COVID-19 Vaccine (4 - Booster for [place] series) 05/14/2020   PAP-Cervical Cytology Screening  05/20/2023   PAP SMEAR-Modifier  05/20/2023   TETANUS/TDAP  01/18/2031   INFLUENZA VACCINE  Completed   Hepatitis C Screening  Completed   HIV Screening  Completed   HPV VACCINES  Aged Out    Review of Systems All review of systems negative except what is listed in the HPI   Objective    Pulse 68    Ht _0  (1.676 m)    Wt 201 lb 9.6 oz (91.4 kg)    LMP  (LMP Unknown)    Breastfeeding No    BMI 32.54 kg/m  Physical Exam  No results found for any visits on 07/05/21.  Assessment & Plan      Problem List Items Addressed This Visit     Attention deficit disorder    Chronic. Medication restart after pregnancy needed.  Historically well  managed with Vyvanse. Adderall causes palpitations/increased anxiety.  Will plan to restart at 17m and titrate as needed if dose is not effective for symptom management.  Recommend f/u if symptoms do not improve with dose, otherwise, will plan for q376month/u as usual. OK to send refills for up to 3 months if patient is tolerating medication well.       Relevant Medications   lisdexamfetamine (VYVANSE) 20 MG capsule   Mixed anxiety and depressive disorder    Chronic. Currently managed with sertraline.  Exacerbation of symptoms likely related to hormonal changes associated with recent childbirth.  Recommend monitoring for new or worsening symptoms and notify immediately if these present.        Iron deficiency anemia    Historical report of IDA with lab update 1 month ago.  Increased fatigue is present.  Recommend continue ferrous sulfate 32552mhree days a week. Labs reviewed on patients phone today. Will plan for f/u labs in 3 months or sooner if symptoms worsen or fail to improve.       Vitamin D deficiency    Recent labs reviewed on patients phone today. Vitamin D down to 17.  Will begin treatment with once weekly HD VitD3 for at least 12  weeks then plan to recheck levels.  F/U if sx worsen or fail to improve.       Relevant Medications   Vitamin D, Ergocalciferol, (DRISDOL) 1.25 MG (50000 UNIT) CAPS capsule   Other Visit Diagnoses     Healthcare maintenance    -  Primary   Relevant Medications   lisdexamfetamine (VYVANSE) 20 MG capsule        Return in about 3 months (around 10/02/2021) for adhd virtual.     Kenzley Ke, SarCoralee PesaP, DNP, AGNP-C [facility]

## 2021-07-05 NOTE — Patient Instructions (Signed)
Thank you for choosing [place] [facility] for your Primary Care needs. I am excited for the opportunity to partner with you to meet your health care goals. It was a pleasure meeting you today!  Recommendations from today's visit: Let me know if the vyvanse dose is not working well for you  Information on diet, exercise, and health maintenance recommendations are listed below. This is information to help you be sure you are on track for optimal health and monitoring.   Please look over this and let us know if you have any questions or if you have completed any of the health maintenance outside of [place] so that we can be sure your records are up to date.  ___________________________________________________________ About Me: I am an Adult-Geriatric Nurse Practitioner with a background in caring for patients for more than 20 years with a strong intensive care background. I provide primary care and sports medicine services to patients age 71 and older within this office. My education had a strong focus on caring for the older adult population, which I am passionate about. I am also the director of the APP Fellowship with [facility].   My desire is to provide you with the best service through preventive medicine and supportive care. I consider you a part of the medical team and value your input. I work diligently to ensure that you are heard and your needs are met in a safe and effective manner. I want you to feel comfortable with me as your provider and want you to know that your health concerns are important to me.  For your information, our office hours are: Monday, Tuesday, and Thursday 8:00 AM - 5:00 PM Wednesday and Friday 8:00 AM - 12:00 PM.   In my time away from the office I am teaching new APP's within the system and am unavailable, but my partner, Dr. Burnard Bunting is in the office for emergent needs.   If you have questions or concerns, please call our office at  [phone] or send [place] a MyChart message and we will respond as quickly as possible.  ____________________________________________________________ MyChart:  For all urgent or time sensitive needs we ask that you please call the office to avoid delays. Our number is [phone]. MyChart is not constantly monitored and due to the large volume of messages a day, replies may take up to 72 business hours.  MyChart Policy: MyChart allows for you to see your visit notes, after visit summary, provider recommendations, lab and tests results, make an appointment, request refills, and contact your provider or the office for non-urgent questions or concerns. Providers are seeing patients during normal business hours and do not have built in time to review MyChart messages.  We ask that you allow a minimum of 3 business days for responses to [employer]. For this reason, please do not send urgent requests through [place]. Please call the office at [phone]. New and ongoing conditions may require a visit. We have virtual and in person visit available for your convenience.  Complex MyChart concerns may require a visit. Your provider may request you schedule a virtual or in person visit to ensure we are providing the best care possible. MyChart messages sent after 11:00 AM on Friday will not be received by the provider until Monday morning.    Lab and Test Results: You will receive your lab and test results on MyChart as soon as they are completed and results have been sent by the lab  or testing facility. Due to this service, you will receive your results BEFORE your provider.  I review lab and tests results each morning prior to seeing patients. Some results require collaboration with other providers to ensure you are receiving the most appropriate care. For this reason, we ask that you please allow a minimum of 3-5 business days from the time the ALL results have been received for your provider to  receive and review lab and test results and contact you about these.  Most lab and test result comments from the provider will be sent through [place]. Your provider may recommend changes to the plan of care, follow-up visits, repeat testing, ask questions, or request an office visit to discuss these results. You may reply directly to this message or call the office at [phone] to provide information for the provider or set up an appointment. In some instances, you will be called with test results and recommendations. Please let us know if this is preferred and we will make note of this in your chart to provide this for you.    If you have not heard a response to your lab or test results in 5 business days from all results returning to [place], please call the office to let us know. We ask that you please avoid calling prior to this time unless there is an emergent concern. Due to high call volumes, this can delay the resulting process.  After Hours: For all non-emergency after hours needs, please call the office at [phone] and select the option to reach the on-call provider service. On-call services are shared between multiple [place] offices and therefore it will not be possible to speak directly with your provider. On-call providers may provide medical advice and recommendations, but are unable to provide refills for maintenance medications.  For all emergency or urgent medical needs after normal business hours, we recommend that you seek care at the closest Urgent Care or Emergency Department to ensure appropriate treatment in a timely manner.  [facility] has a 24 hour emergency room located on the ground floor for your convenience.   Urgent Concerns During the Business Day Providers are seeing patients from 8AM to [place] with a busy schedule and are most often not able to respond to non-urgent calls until the end of the day or the next business day. If you should  have URGENT concerns during the day, please call and speak to the nurse or schedule a same day appointment so that we can address your concern without delay.   Thank you, again, for choosing me as your health care partner. I appreciate your trust and look forward to learning more about you.   Worthy Keeler, DNP, AGNP-c ___________________________________________________________  Health Maintenance Recommendations Screening Testing Mammogram Every 1 -2 years based on history and risk factors Starting at age 51 Pap Smear Ages 21-39 every 3 years Ages 39-65 every 5 years with HPV testing More frequent testing may be required based on results and history Colon Cancer Screening Every 1-10 years based on test performed, risk factors, and history Starting at age 38 Bone Density Screening Every 2-10 years based on history Starting at age 13 for women Recommendations for men differ based on medication usage, history, and risk factors AAA Screening One time ultrasound Men 86-64 years old who have every smoked Lung Cancer Screening Low Dose Lung CT every 12 months Age 42-80 years with a 30 pack-year smoking history who still smoke or who have quit  within the last 15 years  Screening Labs Routine  Labs: Complete Blood Count (CBC), Complete Metabolic Panel (CMP), Cholesterol (Lipid Panel) Every 6-12 months based on history and medications May be recommended more frequently based on current conditions or previous results Hemoglobin A1c Lab Every 3-12 months based on history and previous results Starting at age 34 or earlier with diagnosis of diabetes, high cholesterol, BMI >26, and/or risk factors Frequent monitoring for patients with diabetes to ensure blood sugar control Thyroid Panel (TSH w/ T3 & T4) Every 6 months based on history, symptoms, and risk factors May be repeated more often if on medication HIV One time testing for all patients 39 and older May be repeated more frequently  for patients with increased risk factors or exposure Hepatitis C One time testing for all patients 32 and older May be repeated more frequently for patients with increased risk factors or exposure Gonorrhea, Chlamydia Every 12 months for all sexually active persons 13-24 years Additional monitoring may be recommended for those who are considered high risk or who have symptoms PSA Men 48-45 years old with risk factors Additional screening may be recommended from age 84-69 based on risk factors, symptoms, and history  Vaccine Recommendations Tetanus Booster All adults every 10 years Flu Vaccine All patients 6 months and older every year COVID Vaccine All patients 12 years and older Initial dosing with booster May recommend additional booster based on age and health history HPV Vaccine 2 doses all patients age 62-26 Dosing may be considered for patients over 26 Shingles Vaccine (Shingrix) 2 doses all adults 89 years and older Pneumonia (Pneumovax 23) All adults 24 years and older May recommend earlier dosing based on health history Pneumonia (Prevnar 36) All adults 76 years and older Dosed 1 year after Pneumovax 23  Additional Screening, Testing, and Vaccinations may be recommended on an individualized basis based on family history, health history, risk factors, and/or exposure.  __________________________________________________________  Diet Recommendations for All Patients  I recommend that all patients maintain a diet low in saturated fats, carbohydrates, and cholesterol. While this can be challenging at first, it is not impossible and small changes can make big differences.  Things to try: Decreasing the amount of soda, sweet tea, and/or juice to one or less per day and replace with water While water is always the first choice, if you do not like water you may consider adding a water additive without sugar to improve the taste other sugar free drinks Replace potatoes with  a brightly colored vegetable at dinner Use healthy oils, such as canola oil or olive oil, instead of butter or hard margarine Limit your bread intake to two pieces or less a day Replace regular pasta with low carb pasta options Bake, broil, or grill foods instead of frying Monitor portion sizes  Eat smaller, more frequent meals throughout the day instead of large meals  An important thing to remember is, if you love foods that are not great for your health, you don't have to give them up completely. Instead, allow these foods to be a reward when you have done well. Allowing yourself to still have special treats every once in a while is a nice way to tell yourself thank you for working hard to keep yourself healthy.   Also remember that every day is a new day. If you have a bad day and "fall off the wagon", you can still climb right back up and keep moving along on your journey!  We have  resources available to help you!  Some websites that may be helpful include: www.http://carter.biz/  Www.VeryWellFit.com _____________________________________________________________  Activity Recommendations for All Patients  I recommend that all adults get at least 20 minutes of moderate physical activity that elevates your heart rate at least 5 days out of the week.  Some examples include: Walking or jogging at a pace that allows you to carry on a conversation Cycling (stationary bike or outdoors) Water aerobics Yoga Weight lifting Dancing If physical limitations prevent you from putting stress on your joints, exercise in a pool or seated in a chair are excellent options.  Do determine your MAXIMUM heart rate for activity: YOUR AGE - 220 = MAX HeartRate   Remember! Do not push yourself too hard.  Start slowly and build up your pace, speed, weight, time in exercise, etc.  Allow your body to rest between exercise and get good sleep. You will need more water than normal when you are exerting yourself. Do  not wait until you are thirsty to drink. Drink with a purpose of getting in at least 8, 8 ounce glasses of water a day plus more depending on how much you exercise and sweat.    If you begin to develop dizziness, chest pain, abdominal pain, jaw pain, shortness of breath, headache, vision changes, lightheadedness, or other concerning symptoms, stop the activity and allow your body to rest. If your symptoms are severe, seek emergency evaluation immediately. If your symptoms are concerning, but not severe, please let us know so that we can recommend further evaluation.

## 2021-07-06 ENCOUNTER — Encounter (HOSPITAL_BASED_OUTPATIENT_CLINIC_OR_DEPARTMENT_OTHER): Payer: Self-pay | Admitting: Nurse Practitioner

## 2021-07-06 DIAGNOSIS — F418 Other specified anxiety disorders: Secondary | ICD-10-CM | POA: Insufficient documentation

## 2021-07-06 DIAGNOSIS — E559 Vitamin D deficiency, unspecified: Secondary | ICD-10-CM | POA: Insufficient documentation

## 2021-07-06 DIAGNOSIS — D509 Iron deficiency anemia, unspecified: Secondary | ICD-10-CM | POA: Insufficient documentation

## 2021-07-06 NOTE — Assessment & Plan Note (Signed)
Recent labs reviewed on patients phone today. Vitamin D down to 17.  ?Will begin treatment with once weekly HD VitD3 for at least 12 weeks then plan to recheck levels.  ?F/U if sx worsen or fail to improve.  ?

## 2021-07-06 NOTE — Assessment & Plan Note (Signed)
Chronic. Medication restart after pregnancy needed.  ?Historically well managed with Vyvanse. Adderall causes palpitations/increased anxiety.  ?Will plan to restart at 20mg  and titrate as needed if dose is not effective for symptom management.  ?Recommend f/u if symptoms do not improve with dose, otherwise, will plan for q91month f/u as usual. OK to send refills for up to 3 months if patient is tolerating medication well.  ?

## 2021-07-06 NOTE — Assessment & Plan Note (Signed)
Historical report of IDA with lab update 1 month ago.  ?Increased fatigue is present.  ?Recommend continue ferrous sulfate 325mg  three days a week. Labs reviewed on patients phone today. ?Will plan for f/u labs in 3 months or sooner if symptoms worsen or fail to improve.  ?

## 2021-07-06 NOTE — Assessment & Plan Note (Signed)
Chronic. Currently managed with sertraline.  ?Exacerbation of symptoms likely related to hormonal changes associated with recent childbirth.  ?Recommend monitoring for new or worsening symptoms and notify immediately if these present.  ? ?

## 2021-07-21 ENCOUNTER — Ambulatory Visit (HOSPITAL_BASED_OUTPATIENT_CLINIC_OR_DEPARTMENT_OTHER): Payer: 59 | Admitting: Nurse Practitioner

## 2021-07-22 ENCOUNTER — Telehealth (HOSPITAL_BASED_OUTPATIENT_CLINIC_OR_DEPARTMENT_OTHER): Payer: Self-pay | Admitting: Obstetrics & Gynecology

## 2021-07-22 ENCOUNTER — Encounter (HOSPITAL_BASED_OUTPATIENT_CLINIC_OR_DEPARTMENT_OTHER): Payer: Self-pay | Admitting: Obstetrics & Gynecology

## 2021-07-22 NOTE — Telephone Encounter (Signed)
Called pt in response to my chart message.  Pt having some increased depression symptoms.  PHQ-9 done today.  Scored 10.  Discussed results with pt.  She is on zoloft 150mg  daily.  She does not feel she needs increased dosage.  Would like to start with therapy.  Message sent to [facility] for starting care.  She was seen at 27 weeks but only once.  Also, will try to get pt some other outpatient resource information as well.  Pt does not present harm to self or others at this time.  Feel outpatient management appropriate.   ?

## 2021-07-26 ENCOUNTER — Encounter (HOSPITAL_BASED_OUTPATIENT_CLINIC_OR_DEPARTMENT_OTHER): Payer: Self-pay | Admitting: Nurse Practitioner

## 2021-07-26 NOTE — BH Specialist Note (Signed)
Integrated Behavioral Health via Telemedicine Visit  08/03/2021 Carol Clark [id number]  Number of Integrated Behavioral Health Clinician visits: 1- Initial Visit  Session Start time: 0915   Session End time: 0950  Total time in minutes: 35   Referring Provider: Valentina Shaggy Patient/Family location: Home  [facility] Provider location: [facility] [employer] [facility]   All persons participating in visit: Patient Carol Clark and [facility] Angila Wombles   Types of Service: Individual psychotherapy and Video visit  I connected with [name] and/or Lupe Carney Prichett's  n/a  via  Telephone or Engineer, civil (consulting)  (Video is Surveyor, mining) and verified that I am speaking with the correct person using two identifiers. Discussed confidentiality: Yes   I discussed the limitations of telemedicine and the availability of in person appointments.  Discussed there is a possibility of technology failure and discussed alternative modes of communication if that failure occurs.  I discussed that engaging in this telemedicine visit, they consent to the provision of behavioral healthcare and the services will be billed under their insurance.  Patient and/or legal guardian expressed understanding and consented to Telemedicine visit: Yes   Presenting Concerns: Patient and/or family reports the following symptoms/concerns: Depression, anxiety both increasing over time postpartum, along with negative body image; attributes to continued upcoming exam stress, and adjusting to new motherhood. Pt is taking BH medication as prescribed and open to implementing self-coping strategies.  Duration of problem: Increase postpartum; Severity of problem:  moderately severe  Patient and/or Family's Strengths/Protective Factors: Social connections, Concrete supports in place (healthy food, safe environments, etc.), and Sense of purpose  Goals Addressed: Patient  will:  Reduce symptoms of: anxiety, depression, and stress   Increase knowledge and/or ability of: self-management skills   Demonstrate ability to: Increase healthy adjustment to current life circumstances and Increase motivation to adhere to plan of care  Progress towards Goals: Ongoing  Interventions: Interventions utilized:  Mindfulness or Relaxation Training and CBT Cognitive Behavioral Therapy Standardized Assessments completed: Not Needed  Patient and/or Family Response: Patient agrees with treatment plan.   Assessment: Patient currently experiencing Adjustment disorder with mixed anxious and depressed mood.   Patient may benefit from continued psychoeducation and brief therapeutic interventions regarding coping with symptoms of depression, anxiety, stress .  Plan: Follow up with behavioral health clinician on : Two weeks Behavioral recommendations:  -Continue taking BH medication as prescribed -CALM relaxation breathing exercise twice daily (morning; at bedtime); as needed throughout the day. -Begin Worry Time strategy, as discussed. Start by setting up start and end time reminders on phone today; continue daily for two weeks.  Referral(s): Integrated [employer] (In Clinic)  I discussed the assessment and treatment plan with the patient and/or parent/guardian. They were provided an opportunity to ask questions and all were answered. They agreed with the plan and demonstrated an understanding of the instructions.   They were advised to call back or seek an in-person evaluation if the symptoms worsen or if the condition fails to improve as anticipated.  Valetta Close Perseus Westall, LCSW    07/06/2021    8:16 AM 04/11/2021    2:41 PM 03/18/2021   10:20 AM 01/17/2021   10:03 AM 10/21/2020    1:15 PM  Depression screen PHQ 2/9  Decreased Interest 1 0 1 1 2   Down, Depressed, Hopeless 2 0 1 0 2  PHQ - 2 Score 3 0 2 1 4   Altered sleeping 1  2 2 2   Tired,  decreased energy  2  2 2 2   Change in appetite 0  0 0 0  Feeling bad or failure about yourself  2  1 0 0  Trouble concentrating 2  1 0 1  Moving slowly or fidgety/restless 0  0 0 0  Suicidal thoughts 0  0 0 0  PHQ-9 Score 10  8 5 9   Difficult doing work/chores Somewhat difficult   Not difficult at all Somewhat difficult      07/06/2021    8:17 AM 03/18/2021   10:20 AM 01/17/2021   10:04 AM 09/23/2020    1:10 PM  GAD 7 : Generalized Anxiety Score  Nervous, Anxious, on Edge 2 0 2 2  Control/stop worrying 1 1 1 2   Worry too much - different things 2 1 2 2   Trouble relaxing 2 2 3 3   Restless 0 1 2 1   Easily annoyed or irritable 2 2 1 1   Afraid - awful might happen 0 0 0 0  Total GAD 7 Score 9 7 11 11   Anxiety Difficulty Somewhat difficult

## 2021-07-27 NOTE — Telephone Encounter (Signed)
Pt came by with paperwork and proof of income to go along with this form for this assistance program for medication. Paperwork is providers RED dot. Paperwork needs to be faxed off, fax is on the front page of paperwork. Pt is aware that we will call her when paperwork is completed and faxed off. ? ?Please advise. ?

## 2021-08-02 ENCOUNTER — Other Ambulatory Visit (HOSPITAL_BASED_OUTPATIENT_CLINIC_OR_DEPARTMENT_OTHER): Payer: Self-pay | Admitting: Nurse Practitioner

## 2021-08-02 DIAGNOSIS — Z0000 Encounter for general adult medical examination without abnormal findings: Secondary | ICD-10-CM

## 2021-08-02 DIAGNOSIS — F909 Attention-deficit hyperactivity disorder, unspecified type: Secondary | ICD-10-CM

## 2021-08-03 ENCOUNTER — Ambulatory Visit (INDEPENDENT_AMBULATORY_CARE_PROVIDER_SITE_OTHER): Payer: Self-pay | Admitting: Clinical

## 2021-08-03 DIAGNOSIS — F4323 Adjustment disorder with mixed anxiety and depressed mood: Secondary | ICD-10-CM

## 2021-08-03 MED ORDER — LISDEXAMFETAMINE DIMESYLATE 20 MG PO CAPS: 20.0000 mg | ORAL_CAPSULE | Freq: Every day | ORAL | 0 refills | Status: DC

## 2021-08-03 NOTE — Patient Instructions (Signed)
[facility] [address] [place], [place] [address] [phone] (main office) [phone] (Dalicia Kisner's office)   

## 2021-08-17 ENCOUNTER — Ambulatory Visit (INDEPENDENT_AMBULATORY_CARE_PROVIDER_SITE_OTHER): Payer: 59 | Admitting: Clinical

## 2021-08-17 DIAGNOSIS — F4323 Adjustment disorder with mixed anxiety and depressed mood: Secondary | ICD-10-CM

## 2021-08-17 NOTE — BH Specialist Note (Signed)
Integrated Behavioral Health via Telemedicine Visit ? ?08/17/2021 ?Carol Clark ?[id number] ? ?Number of Integrated Behavioral Health Clinician visits: 3- Third Visit ? ?Session Start time: [phone] ?  ?Session End time: 0846 ? ?Total time in minutes: 22 ? ? ?Referring Provider: Valentina Shaggy, MD ?Patient/Family location: Home ?[facility] Provider location: [facility] [employer] [facility] ? ?All persons participating in visit: Patient Carol Clark and [facility] Nelida Mandarino  ? ?Types of Service: Individual psychotherapy and Video visit ? ?I connected with Carol Clark and/or Lupe Carney Mathisen's  n/a  via  Telephone or Engineer, civil (consulting)  (Video is Surveyor, mining) and verified that I am speaking with the correct person using two identifiers. Discussed confidentiality: Yes  ? ?I discussed the limitations of telemedicine and the availability of in person appointments.  Discussed there is a possibility of technology failure and discussed alternative modes of communication if that failure occurs. ? ?I discussed that engaging in this telemedicine visit, they consent to the provision of behavioral healthcare and the services will be billed under their insurance. ? ?Patient and/or legal guardian expressed understanding and consented to Telemedicine visit: Yes  ? ?Presenting Concerns: ?Patient and/or family reports the following symptoms/concerns: Increase in life stress the past two weeks; taking medication and using self-coping strategies to manage emotions.  ?Duration of problem: Increase postpartum; Severity of problem: moderate ? ?Patient and/or Family's Strengths/Protective Factors: ?Concrete supports in place (healthy food, safe environments, etc.) and Sense of purpose ? ?Goals Addressed: ?Patient will: ? Reduce symptoms of: anxiety, depression, and stress  ? Demonstrate ability to: Increase healthy adjustment to current life circumstances ? ?Progress towards  Goals: ?Ongoing ? ?Interventions: ?Interventions utilized:  Supportive Reflection ?Standardized Assessments completed: GAD-7 and PHQ 9 ? ?Patient and/or Family Response: Patient agrees with treatment plan. ? ? ?Assessment: ?Patient currently experiencing Adjustment disorder with mixed anxious and depressed mood.  ? ?Patient may benefit from continued brief therapeutic interventions. ? ?Plan: ?Follow up with behavioral health clinician on : Two weeks ?Behavioral recommendations:  ?-Continue taking all medications as prescribed (including Vyvanse and Zoloft) ?-Continue using daily self-coping strategies (relaxation breathing; outdoor walks) as needed throughout the day ?Referral(s): Integrated [employer] (In Clinic) ? ?I discussed the assessment and treatment plan with the patient and/or parent/guardian. They were provided an opportunity to ask questions and all were answered. They agreed with the plan and demonstrated an understanding of the instructions. ?  ?They were advised to call back or seek an in-person evaluation if the symptoms worsen or if the condition fails to improve as anticipated. ? ?Rae Lips, LCSW ? ? ?  08/17/2021  ?  8:33 AM 07/06/2021  ?  8:16 AM 04/11/2021  ?  2:41 PM 03/18/2021  ? 10:20 AM 01/17/2021  ? 10:03 AM  ?Depression screen PHQ 2/9  ?Decreased Interest 1 1 0 1 1  ?Down, Depressed, Hopeless 1 2 0 1 0  ?PHQ - 2 Score 2 3 0 2 1  ?Altered sleeping 1 1  2 2   ?Tired, decreased energy 1 2  2 2   ?Change in appetite 0 0  0 0  ?Feeling bad or failure about yourself  1 2  1  0  ?Trouble concentrating 2 2  1  0  ?Moving slowly or fidgety/restless 0 0  0 0  ?Suicidal thoughts 0 0  0 0  ?PHQ-9 Score 7 10  8 5   ?Difficult doing work/chores  Somewhat difficult   Not difficult at all  ? ? ?  08/17/2021  ?  8:34 AM 07/06/2021  ?  8:17 AM 03/18/2021  ? 10:20 AM 01/17/2021  ? 10:04 AM  ?GAD 7 : Generalized Anxiety Score  ?Nervous, Anxious, on Edge 1 2 0 2  ?Control/stop worrying 1 1 1 1   ?Worry  too much - different things 1 2 1 2   ?Trouble relaxing 2 2 2 3   ?Restless 1 0 1 2  ?Easily annoyed or irritable 1 2 2 1   ?Afraid - awful might happen 0 0 0 0  ?Total GAD 7 Score 7 9 7 11   ?Anxiety Difficulty  Somewhat difficult    ? ? ? ? ? ?

## 2021-08-18 NOTE — BH Specialist Note (Signed)
Integrated Behavioral Health via Telemedicine Visit ? ?08/18/2021 ?Carol Clark ?[id number] ? ?Number of Integrated Behavioral Health Clinician visits: 3- Third Visit ? ?Session Start time: [phone] ?  ?Session End time: 0846 ? ?Total time in minutes: 22 ? ? ?Referring Provider: Valentina Shaggy, MD ?Patient/Family location: Home ?[facility] Provider location: [facility] for [employer] at [facility] ? ?All persons participating in visit: Patient Carol Clark and [facility] Carol Clark  ? ?Types of Service: Individual psychotherapy and Video visit ? ?I connected with Carol Clark and/or Carol Clark's  n/a  via  Telephone or Engineer, civil (consulting)  (Video is Surveyor, mining) and verified that I am speaking with the correct person using two identifiers. Discussed confidentiality: Yes  ? ?I discussed the limitations of telemedicine and the availability of in person appointments.  Discussed there is a possibility of technology failure and discussed alternative modes of communication if that failure occurs. ? ?I discussed that engaging in this telemedicine visit, they consent to the provision of behavioral healthcare and the services will be billed under their insurance. ? ?Patient and/or legal guardian expressed understanding and consented to Telemedicine visit: Yes  ? ?Presenting Concerns: ?Patient and/or family reports the following symptoms/concerns: No concerns today, feels like life and emotions are balanced and manageable at this time.  ?Duration of problem: Postpartum; Severity of problem: mild ? ?Patient and/or Family's Strengths/Protective Factors: ?Social and Emotional competence, Concrete supports in place (healthy food, safe environments, etc.), Sense of purpose, and Physical Health (exercise, healthy diet, medication compliance, etc.) ? ?Goals Addressed: ?Patient will: ? Maintain reduction of symptoms of: anxiety, depression, and stress  ? Demonstrate ability to:  Increase healthy adjustment to current life circumstances ? ?Progress towards Goals: ?Achieved ? ?Interventions: ?Interventions utilized:  Supportive Reflection ?Standardized Assessments completed: Not Needed ? ?Patient and/or Family Response: Patient agrees with treatment plan. ? ? ?Assessment: ?Patient currently experiencing Adjustment disorder with mixed anxious and depressed mood, resolved.  ? ?Patient may benefit from brief therapeutic intervention today. ? ?Plan: ?Follow up with behavioral health clinician on : Call Nainoa Woldt at [phone], as needed. ?Behavioral recommendations:  ?-Continue Vyvanse and Zoloft as prescribed ?-Continue using daily self-coping strategies daily as needed (and consider adding in "fun" learning, in the midst of study times, as discussed) ?Referral(s): Integrated [employer] (In Clinic) ? ?I discussed the assessment and treatment plan with the patient and/or parent/guardian. They were provided an opportunity to ask questions and all were answered. They agreed with the plan and demonstrated an understanding of the instructions. ?  ?They were advised to call back or seek an in-person evaluation if the symptoms worsen or if the condition fails to improve as anticipated. ? ?Rae Lips, LCSW ? ? ?  08/17/2021  ?  8:33 AM 07/06/2021  ?  8:16 AM 04/11/2021  ?  2:41 PM 03/18/2021  ? 10:20 AM 01/17/2021  ? 10:03 AM  ?Depression screen PHQ 2/9  ?Decreased Interest 1 1 0 1 1  ?Down, Depressed, Hopeless 1 2 0 1 0  ?PHQ - 2 Score 2 3 0 2 1  ?Altered sleeping 1 1  2 2   ?Tired, decreased energy 1 2  2 2   ?Change in appetite 0 0  0 0  ?Feeling bad or failure about yourself  1 2  1  0  ?Trouble concentrating 2 2  1  0  ?Moving slowly or fidgety/restless 0 0  0 0  ?Suicidal thoughts 0 0  0 0  ?PHQ-9 Score 7  10  8 5   ?Difficult doing work/chores  Somewhat difficult   Not difficult at all  ? ? ?  08/17/2021  ?  8:34 AM 07/06/2021  ?  8:17 AM 03/18/2021  ? 10:20 AM 01/17/2021  ? 10:04 AM  ?GAD 7  : Generalized Anxiety Score  ?Nervous, Anxious, on Edge 1 2 0 2  ?Control/stop worrying 1 1 1 1   ?Worry too much - different things 1 2 1 2   ?Trouble relaxing 2 2 2 3   ?Restless 1 0 1 2  ?Easily annoyed or irritable 1 2 2 1   ?Afraid - awful might happen 0 0 0 0  ?Total GAD 7 Score 7 9 7 11   ?Anxiety Difficulty  Somewhat difficult    ? ? ? ?

## 2021-08-25 ENCOUNTER — Telehealth (HOSPITAL_BASED_OUTPATIENT_CLINIC_OR_DEPARTMENT_OTHER): Payer: Self-pay | Admitting: Nurse Practitioner

## 2021-08-25 ENCOUNTER — Other Ambulatory Visit (HOSPITAL_BASED_OUTPATIENT_CLINIC_OR_DEPARTMENT_OTHER): Payer: Self-pay | Admitting: Nurse Practitioner

## 2021-08-25 DIAGNOSIS — Z0000 Encounter for general adult medical examination without abnormal findings: Secondary | ICD-10-CM

## 2021-08-25 DIAGNOSIS — F909 Attention-deficit hyperactivity disorder, unspecified type: Secondary | ICD-10-CM

## 2021-08-25 MED ORDER — VYVANSE 20 MG PO CAPS: 20.0000 mg | ORAL_CAPSULE | Freq: Every day | ORAL | 0 refills | Status: DC

## 2021-08-25 NOTE — Telephone Encounter (Signed)
Pt came in on 4/20 to drop off forms for Vyvanse medication assistance program. Pt is requesting a copy of forms when completed. Also states that financial information must be sent along with application, she has provided this as well. Document's will be in provider's red dot tray. Informed pt that turn around time is about 5-7 business days. ?

## 2021-08-25 NOTE — Progress Notes (Signed)
Patient assistance paperwork received and paperwork completed 08/25/2021. Paper prescriptions printed. ?Completed paperwork and patient completed information faxed [phone]) to [employer] on behalf of the patient on 08/25/2021.  ?

## 2021-08-31 ENCOUNTER — Encounter (HOSPITAL_BASED_OUTPATIENT_CLINIC_OR_DEPARTMENT_OTHER): Payer: Self-pay

## 2021-08-31 ENCOUNTER — Ambulatory Visit: Payer: 59 | Admitting: Clinical

## 2021-08-31 DIAGNOSIS — F4323 Adjustment disorder with mixed anxiety and depressed mood: Secondary | ICD-10-CM

## 2021-08-31 NOTE — Telephone Encounter (Signed)
Pt called in regarding resch her virtual appt for 5/25 and wanted to know the status of her forms she dropped off on 4/20. Please advise. ?

## 2021-09-06 NOTE — Telephone Encounter (Signed)
This is communication is overdue per our office policy. Please complete today and document. ?

## 2021-09-07 NOTE — Telephone Encounter (Signed)
Pt is calling again regarding forms and if they are completed. Pt would like a call when they are completed and also would like a copy of the complied forms. ?Please advise. ?

## 2021-09-08 ENCOUNTER — Other Ambulatory Visit (HOSPITAL_BASED_OUTPATIENT_CLINIC_OR_DEPARTMENT_OTHER): Payer: Self-pay | Admitting: Nurse Practitioner

## 2021-09-08 DIAGNOSIS — F909 Attention-deficit hyperactivity disorder, unspecified type: Secondary | ICD-10-CM

## 2021-09-08 MED ORDER — VYVANSE 20 MG PO CAPS: 20.0000 mg | ORAL_CAPSULE | Freq: Every day | ORAL | 0 refills | Status: DC

## 2021-09-08 NOTE — Telephone Encounter (Signed)
Communication to patient will be overdue tomorrow (10 business days). Please resolve ?

## 2021-09-09 NOTE — Telephone Encounter (Signed)
LVM for pt letter her know that Provider sent these off the day she brought them in received confirmation. Copy made for the office and pt will get the original. Let pt know that we close at 12 today Friday 5/5 and reopen on Monday 5/8 at 8. Pts copy/original will be upfront in the blue accordion file under S. ?Please advise. ?

## 2021-09-13 ENCOUNTER — Other Ambulatory Visit (HOSPITAL_BASED_OUTPATIENT_CLINIC_OR_DEPARTMENT_OTHER): Payer: Self-pay

## 2021-09-13 DIAGNOSIS — F909 Attention-deficit hyperactivity disorder, unspecified type: Secondary | ICD-10-CM

## 2021-09-14 ENCOUNTER — Other Ambulatory Visit (HOSPITAL_BASED_OUTPATIENT_CLINIC_OR_DEPARTMENT_OTHER): Payer: Self-pay | Admitting: Nurse Practitioner

## 2021-09-14 DIAGNOSIS — F909 Attention-deficit hyperactivity disorder, unspecified type: Secondary | ICD-10-CM

## 2021-09-14 MED ORDER — VYVANSE 20 MG PO CAPS: 20.0000 mg | ORAL_CAPSULE | Freq: Every day | ORAL | 0 refills | Status: DC

## 2021-09-21 ENCOUNTER — Other Ambulatory Visit (HOSPITAL_BASED_OUTPATIENT_CLINIC_OR_DEPARTMENT_OTHER): Payer: Self-pay | Admitting: Nurse Practitioner

## 2021-09-21 DIAGNOSIS — E559 Vitamin D deficiency, unspecified: Secondary | ICD-10-CM

## 2021-09-28 ENCOUNTER — Ambulatory Visit (INDEPENDENT_AMBULATORY_CARE_PROVIDER_SITE_OTHER): Payer: 59 | Admitting: Nurse Practitioner

## 2021-09-28 ENCOUNTER — Encounter (HOSPITAL_BASED_OUTPATIENT_CLINIC_OR_DEPARTMENT_OTHER): Payer: Self-pay | Admitting: Nurse Practitioner

## 2021-09-28 DIAGNOSIS — F909 Attention-deficit hyperactivity disorder, unspecified type: Secondary | ICD-10-CM | POA: Diagnosis not present

## 2021-09-28 MED ORDER — LISDEXAMFETAMINE DIMESYLATE 30 MG PO CAPS: 30.0000 mg | ORAL_CAPSULE | Freq: Every day | ORAL | 0 refills | Status: DC

## 2021-09-28 NOTE — Progress Notes (Signed)
Virtual Visit Encounter telephone visit.   I connected with  Carol Clark on 09/28/21 at 11:10 AM EDT by secure audio telemedicine application. I verified that I am speaking with the correct person using two identifiers.   I introduced myself as a Publishing rights manager with the practice. The limitations of evaluation and management by telemedicine discussed with the patient and the availability of in person appointments. The patient expressed verbal understanding and consent to proceed.  Participating parties in this visit include: Myself and patient  The patient is: Patient Location: Home I am: Provider Location: Office/Clinic Subjective:    CC and HPI: Carol Clark is a 26 y.o. year old female presenting for follow up of ADHD.  ADHD Follow-Up Patient reports the following: symptoms are  moderately controlled   is satisfied with current medication is taking medication as prescribed is taking medication on weekends and holidays Work/School performance is  good is able to maintain focus throughout the day is able to sustain tasks requiring attention is able to complete tasks is not easily distracted is not experiencing negative side effects of medication is not  experiencing increased anxiety is not  experiencing difficulty sleeping or falling asleep is not  experiencing irritibility is not more anxious than without medication is not experiencing dizziness is not experiencing rebound effects when medication wears off does wish to remain on current therapy but does feel she would benefit from increased dosage due to incomplete resolution of symptoms on current dose.   Patient reports the following:  Past medical history, Surgical history, Family history not pertinant except as noted below, Social history, Allergies, and medications have been entered into the medical record, reviewed, and corrections made.   Review of Systems:  All review of systems negative except what is  listed in the HPI  Objective:    Alert and oriented x 4 Speaking in clear sentences with no shortness of breath. No distress.  Impression and Recommendations:    Problem List Items Addressed This Visit   None   orders and follow up as documented in EMR I discussed the assessment and treatment plan with the patient. The patient was provided an opportunity to ask questions and all were answered. The patient agreed with the plan and demonstrated an understanding of the instructions.   The patient was advised to call back or seek an in-person evaluation if the symptoms worsen or if the condition fails to improve as anticipated.  Follow-Up: in 3 months  I provided 21 minutes of non-face-to-face interaction with this non face-to-face encounter including intake, same-day documentation, and chart review.   Tollie Eth, NP , DNP, AGNP-c [facility] at [facility] [phone] (fax)

## 2021-09-28 NOTE — Assessment & Plan Note (Signed)
Chronic.  Recent restart of Vyvanse at 20 mg.  Patient is experiencing improvement of her symptoms however control is not complete at this time.  Discussed option to increase dosage with patient and she is in agreement to this.  We will plan to increase dose to 30 mg once daily and monitor closely for symptoms.  Patient will notify if medication dosage does not seem to be effective for treatment at this time.  No alarm symptoms present today.  PDMP reviewed today.  We will follow-up in 3 months or sooner if needed.

## 2021-09-29 ENCOUNTER — Telehealth (HOSPITAL_BASED_OUTPATIENT_CLINIC_OR_DEPARTMENT_OTHER): Payer: 59 | Admitting: Nurse Practitioner

## 2021-11-09 ENCOUNTER — Encounter (HOSPITAL_BASED_OUTPATIENT_CLINIC_OR_DEPARTMENT_OTHER): Payer: Self-pay | Admitting: Obstetrics & Gynecology

## 2021-11-17 ENCOUNTER — Encounter (HOSPITAL_BASED_OUTPATIENT_CLINIC_OR_DEPARTMENT_OTHER): Payer: Self-pay | Admitting: Obstetrics & Gynecology

## 2021-11-18 ENCOUNTER — Other Ambulatory Visit (HOSPITAL_BASED_OUTPATIENT_CLINIC_OR_DEPARTMENT_OTHER): Payer: Self-pay | Admitting: Obstetrics & Gynecology

## 2021-11-18 DIAGNOSIS — F419 Anxiety disorder, unspecified: Secondary | ICD-10-CM

## 2021-11-18 MED ORDER — SERTRALINE HCL 100 MG PO TABS: 200.0000 mg | ORAL_TABLET | Freq: Every day | ORAL | 3 refills | Status: DC

## 2021-12-03 IMAGING — US US MFM OB DETAIL+14 WK
1 series · 12 of 28 positions shown · non-contrast
Comparison: none

[Series 1: us mfm ob detail+14 wk · 125 acquisitions, 12 frames shown]
[im 5/125]
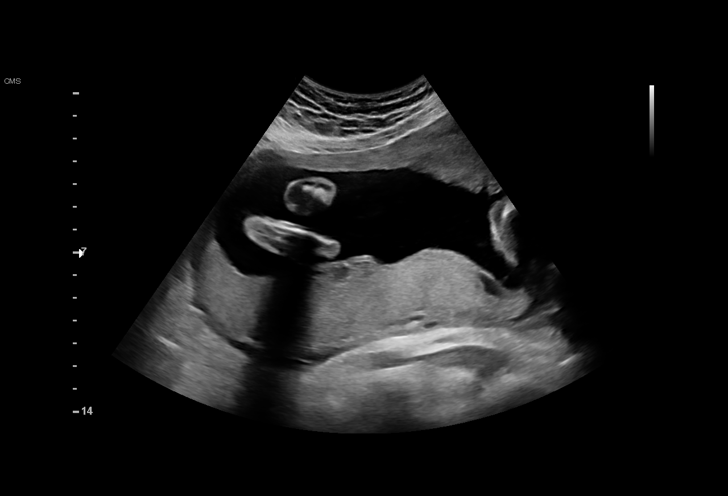
[im 14/125]
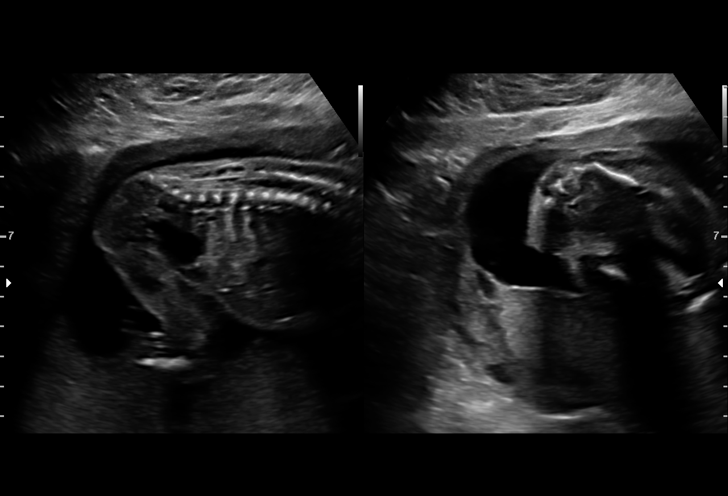
[im 23/125]
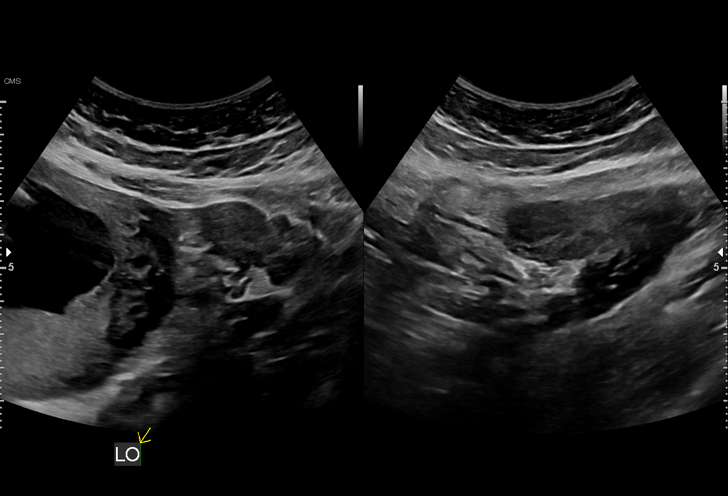
[im 37/125]
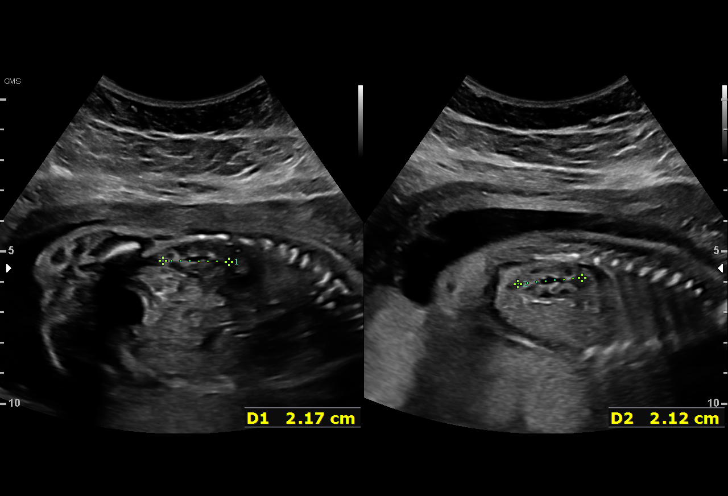
[im 46/125]
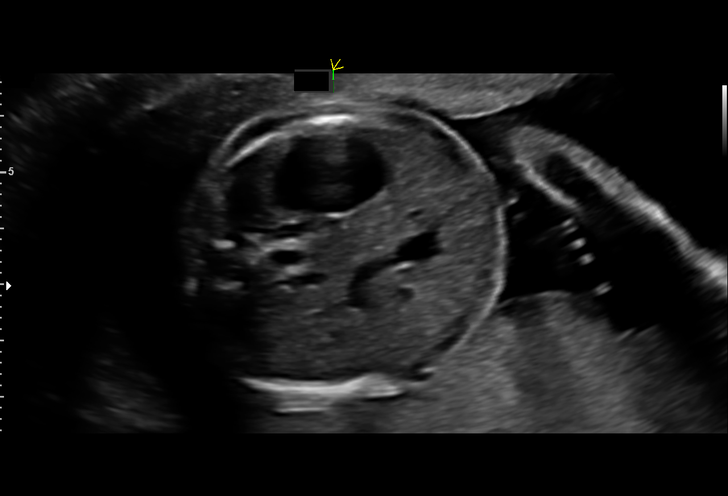
[im 56/125]
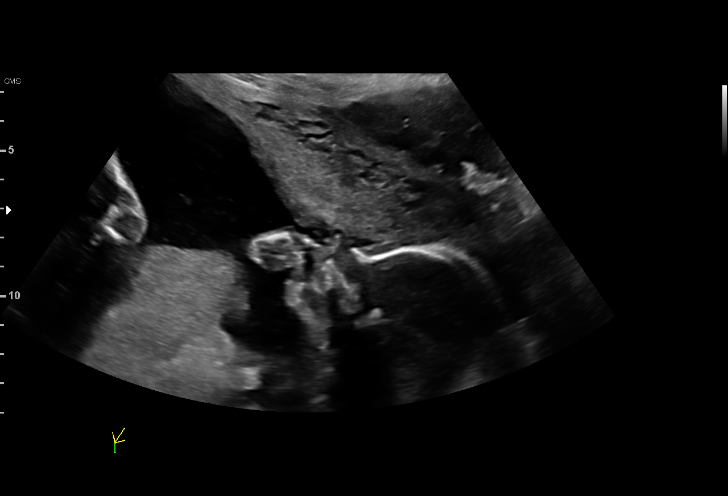
[im 69/125]
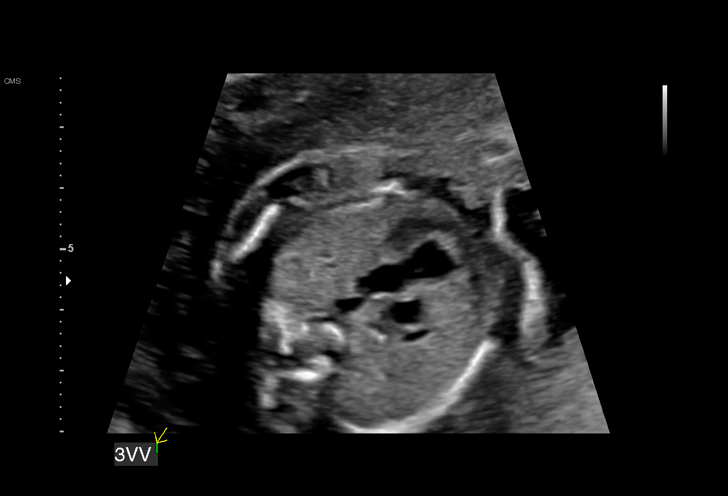
[im 79/125]
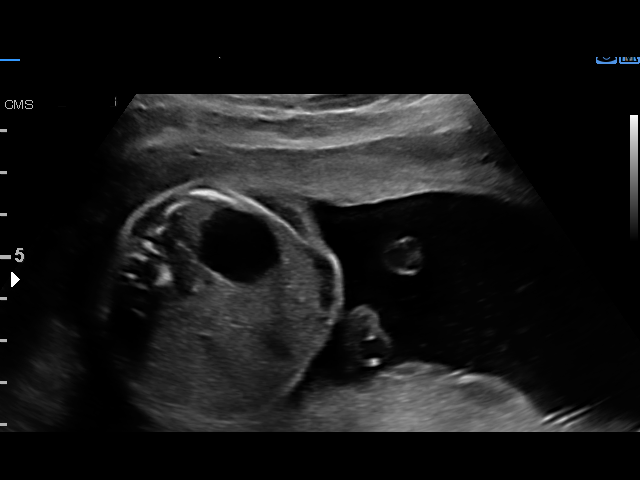
[im 88/125]
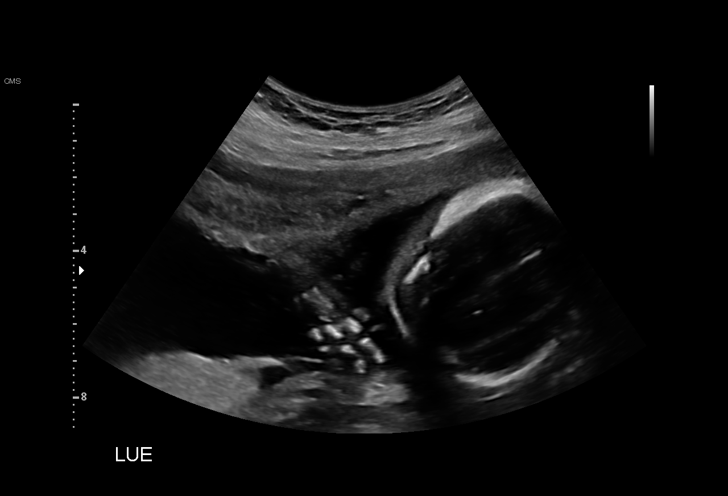
[im 102/125]
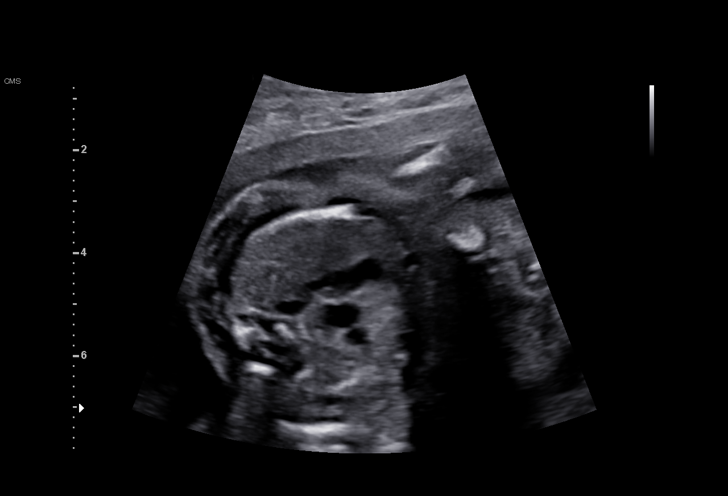
[im 111/125]
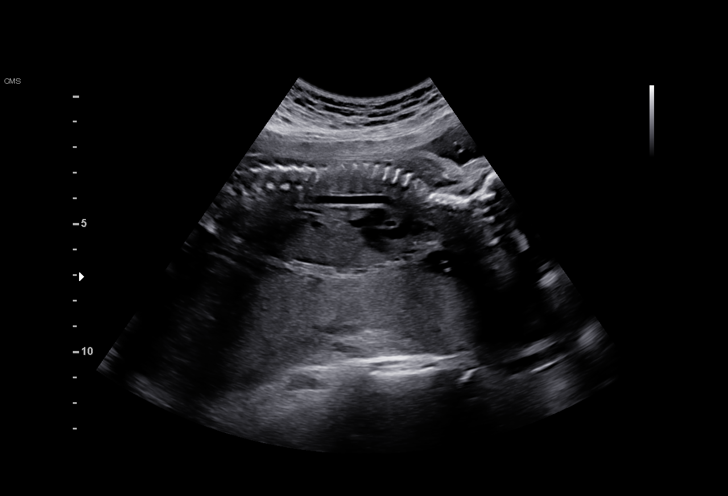
[im 120/125]
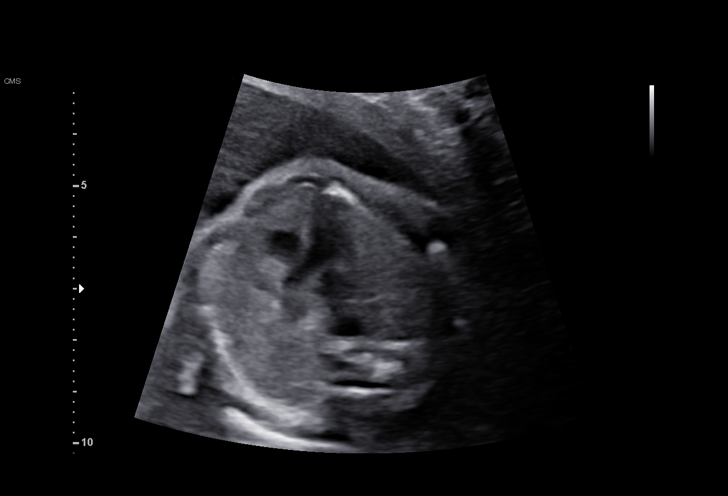

[12 of 28 positions shown; findings below may reference images not displayed]

Indications

 Medical complication of pregnancy
 (palpitations - metoprolol prescribed by
 cards)
 22 weeks gestation of pregnancy
 Antenatal screening for malformations
 Low risk NIPS, neg AFP/Horizon 4
Fetal Evaluation

 Num Of Fetuses:         1
 Fetal Heart Rate(bpm):  137
 Cardiac Activity:       Observed
 Presentation:           Cephalic
 Placenta:               Posterior
 P. Cord Insertion:      Visualized

 Amniotic Fluid
 AFI FV:      Within normal limits

                             Largest Pocket(cm)
                             5
Biometry

 BPD:      54.5  mm     G. Age:  22w 4d         47  %    CI:        68.54   %    70 - 86
                                                         FL/HC:      18.6   %    19.2 -
 HC:      210.4  mm     G. Age:  23w 1d         59  %    HC/AC:      1.16        1.05 -
 AC:      181.7  mm     G. Age:  23w 0d         56  %    FL/BPD:     71.7   %    71 - 87
 FL:       39.1  mm     G. Age:  22w 4d         39  %    FL/AC:      21.5   %    20 - 24
 HUM:      35.7  mm     G. Age:  22w 3d         40  %
 CER:      24.9  mm     G. Age:  22w 5d         80  %
 LV:        4.9  mm
 CM:        4.3  mm

 Est. FW:     538  gm      1 lb 3 oz     56  %
OB History

 Gravidity:    1         Term:   0        Prem:   0        SAB:   0
 TOP:          0       Ectopic:  0        Living: 0
Gestational Age

 LMP:           22w 4d        Date:  07/09/20                 EDD:   04/15/21
 U/S Today:     22w 6d                                        EDD:   04/13/21
 Best:          22w 4d     Det. By:  LMP  (07/09/20)          EDD:   04/15/21
Anatomy

 Cranium:               Appears normal         Aortic Arch:            Appears normal
 Cavum:                 Appears normal         Ductal Arch:            Appears normal
 Ventricles:            Appears normal         Diaphragm:              Appears normal
 Choroid Plexus:        Appears normal         Stomach:                Appears normal, left
                                                                       sided
 Cerebellum:            Appears normal         Abdomen:                Appears normal
 Posterior Fossa:       Appears normal         Abdominal Wall:         Appears nml (cord
                                                                       insert, abd wall)
 Nuchal Fold:           Not applicable (>20    Cord Vessels:           Appears normal (3
                        wks GA)                                        vessel cord)
 Face:                  Appears normal         Kidneys:                Appear normal
                        (orbits and profile)
 Lips:                  Appears normal         Bladder:                Appears normal
 Thoracic:              Appears normal         Spine:                  Appears normal
 Heart:                 Appears normal         Upper Extremities:      Appears normal
                        (4CH, axis, and
                        situs)
 RVOT:                  Appears normal         Lower Extremities:      Appears normal
 LVOT:                  Appears normal
Targeted Anatomy

 Central Nervous System
 Cereb./Vermis:         Appears normal

 Head/Neck
 Nasal Bone:            Present                Mandible:               Appears normal
 Orbits/Eyes:           Nml w/ lenses          Maxilla:                Appears normal

 Thorax
 SVC:                   Appears normal         3 V Trachea View:       Appears normal
 3 Vessel View:         Appears normal         IVC:                    Appears normal

 Extremities
 Lt Humerus:            Appears normal         Rt Femur:               Appears normal
 Rt Humerus:            Appears normal         Lt Lower Leg:           Appears normal
 Lt Forearm:            Appears normal         Rt Lower Leg:           Appears normal
 Rt Forearm:            Appears normal         Lt Foot:                Nml heel/foot
 Lt Hand:               Open hand nml          Rt Foot:                Nml heel/foot
 Lt Femur:              Appears normal
 Other
 Genitalia:             Normal Female
Cervix Uterus Adnexa

 Cervix
 Length:            3.4  cm.
 Normal appearance by transabdominal scan.

 Uterus
 No abnormality visualized.

 Right Ovary
 Not visualized.

 Left Ovary
 Within normal limits.

 Cul De Sac
 No free fluid seen.

 Adnexa
 No abnormality visualized.
Comments

 This patient was seen for a detailed fetal anatomy scan due
 to maternal treatment with metoprolol.  She reports that she
 was placed on metoprolol by cardiology due to persistent
 palpitations.  The cause of her palpitations remains
 undetermined but may possibly be related to anxiety.  She
 reports that her thyroid function tests have been within
 normal limits.
 She denies any other significant past medical history and
 denies any problems in her current pregnancy.
 She had a cell free DNA test earlier in her pregnancy which
 indicated a low risk for trisomy 21, 18, and 13. A female fetus
 is predicted.
 She was informed that the fetal growth and amniotic fluid
 level were appropriate for her gestational age.
 There were no obvious fetal anomalies noted on today's
 ultrasound exam.
 The patient was informed that anomalies may be missed due
 to technical limitations. If the fetus is in a suboptimal position
 or maternal habitus is increased, visualization of the fetus in
 the maternal uterus may be impaired.
 Due to beta-blocker treatment, we will continue to follow her
 with growth ultrasounds throughout her pregnancy.
 A follow-up growth scan was scheduled in 4 weeks

## 2021-12-04 ENCOUNTER — Encounter (HOSPITAL_BASED_OUTPATIENT_CLINIC_OR_DEPARTMENT_OTHER): Payer: Self-pay | Admitting: Nurse Practitioner

## 2021-12-06 ENCOUNTER — Other Ambulatory Visit (HOSPITAL_BASED_OUTPATIENT_CLINIC_OR_DEPARTMENT_OTHER): Payer: Self-pay | Admitting: Obstetrics & Gynecology

## 2021-12-06 DIAGNOSIS — F4323 Adjustment disorder with mixed anxiety and depressed mood: Secondary | ICD-10-CM

## 2021-12-06 DIAGNOSIS — F419 Anxiety disorder, unspecified: Secondary | ICD-10-CM

## 2021-12-07 ENCOUNTER — Other Ambulatory Visit: Payer: Self-pay | Admitting: Obstetrics and Gynecology

## 2021-12-07 DIAGNOSIS — F419 Anxiety disorder, unspecified: Secondary | ICD-10-CM

## 2021-12-13 NOTE — Telephone Encounter (Signed)
Called pt and scheduled pt for 8/18

## 2021-12-14 ENCOUNTER — Ambulatory Visit (INDEPENDENT_AMBULATORY_CARE_PROVIDER_SITE_OTHER): Payer: 59 | Admitting: Behavioral Health

## 2021-12-14 DIAGNOSIS — F4323 Adjustment disorder with mixed anxiety and depressed mood: Secondary | ICD-10-CM

## 2021-12-14 NOTE — Progress Notes (Signed)
                Carol Clark L Sharyl Panchal, LMFT 

## 2021-12-14 NOTE — Progress Notes (Signed)
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 This will be her 3rd attempt to pass & she is questioning her sense of calling & what she will do if she does not pass.  Pt has been in extreme distress over this issue to the point of exp'g SI. She feels her dep has worsened as she is questioning her purpose in life she was so sure about in the past.  Husb Carol Clark is very supportive & reminds Pt they will work through this no matter what happens.  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 Pt feels defeated by not passing first 2 attempts.   Risk Assessment: Danger to Self:   Pt is pos for SI, neg for SA ; she has no plans or intentions of committing suicide. Pt has lost her sense of purpose & feels her thoughts are like a hamster wheel @ times. Self-injurious Behavior: No Danger to Others: No Duty to Warn:no Physical Aggression / Violence:No  Access to Firearms a concern: No  Gang Involvement:No  Patient / guardian was educated about steps to take if suicide or homicide risk level increases between visits: yes While  future psychiatric events cannot be accurately predicted, the patient does not currently require acute inpatient psychiatric care and does not currently meet [facility] involuntary commitment criteria.  Although Pt denies danger to self, she is given 62 & addt'l suicide resources for btwn sessions.   Substance Abuse History: Current substance abuse: No     Past Psychiatric History:   No previous psychological problems have been observed Outpatient Providers: Dr. Valentina Shaggy, MD (OB-GYN) History of Psych Hospitalization: No  Psychological Testing:  NA    Abuse History:  Victim of: No.,  NA    Report needed: No. Victim of Neglect:No. Perpetrator of  NA   Witness / Exposure to Domestic Violence: No   Protective Services Involvement: No  Witness to [employer] Violence:  No   Family History:  Family History  Problem Relation Age of Onset   Hypertension Father    Hyperlipidemia Father    Fibromyalgia Mother    Hyperlipidemia Maternal Grandmother    Hyperlipidemia Maternal Grandfather    Depression Maternal Grandfather    COPD Paternal Grandmother        pancreatic   Heart disease Paternal Grandfather    Cancer Neg Hx    Diabetes Neg Hx     Living situation: the patient lives with their family  Sexual Orientation: Straight  Relationship Status:  married  Name of spouse / other: Carol Clark; married 3 yrs If a parent, number of children / ages:8 mos old Carol Clark (Dtr is in Daycare so Pt can study)  Support Systems: spouse friends parents  Surveyor, quantity Stress:  No   Income/Employment/Disability: Spouse works for the [employer] in [employer] @ Chartered loss adjuster Service: No   Educational History: Education:  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 Pt is stressed by her Student Loan debt & feels it is her resp to address this debt.  Religion/Sprituality/World View: Pt was raised So. [place] & has "known the Lord since she was 6 or  26yo". Pt was raised in a loving home.  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 College tested Pt's faith when in one year she lost her 3 favorite Gandparents.  Any cultural differences that may affect / interfere with treatment:  spiritual concerns / distress  Recreation/Hobbies: not noted today  Stressors: Educational concerns   Financial difficulties   Loss of her perceived dream of careers & testing of her faith    Strengths: Supportive Relationships, Family, Friends, Church, Spirituality, and Able to Communicate Effectively  Barriers:  Pt often becomes psychologically overwhelmed dealing w/her ADHD, her infant Dtr, & her 2 pets in the home, & the need for a routine that she can manage successfully.   Legal History: Pending legal issue / charges: The patient has no significant history of legal issues. History of legal issue / charges:  NA  Medical History/Surgical History: reviewed Past Medical History:  Diagnosis Date   ADHD    Anxiety    Insomnia 11/22/2020   Palpitations 11/22/2020   Status post vacuum-assisted vaginal delivery 04/08/2021   Delivery 04/08/21    No past surgical history on file.  Medications: Current Outpatient Medications  Medication Sig Dispense Refill   ferrous sulfate 325 (65 FE) MG EC tablet Take 325 mg by mouth daily.     lisdexamfetamine (VYVANSE) 30 MG capsule Take 1 capsule (30 mg total) by mouth daily. Script 1 of 3 30 capsule 0   lisdexamfetamine (VYVANSE) 30 MG capsule Take 1 capsule (30 mg total) by mouth daily. Script 2 of 3 30 capsule 0   lisdexamfetamine (VYVANSE) 30 MG capsule Take 1 capsule (30 mg total) by mouth daily. Script 3 of 3 30 capsule 0   metoprolol tartrate (LOPRESSOR) 25 MG tablet Take 12.5 mg by mouth 2 (two) times daily as needed.     norgestimate-ethinyl estradiol (ORTHO-CYCLEN) 0.25-35 MG-MCG tablet Take 1 tablet by mouth daily. 28 tablet 11   sertraline (ZOLOFT) 100 MG tablet Take 2 tablets (200 mg total) by mouth daily. 60  tablet 3   Vitamin D, Ergocalciferol, (DRISDOL) 1.25 MG (50000 UNIT) CAPS capsule TAKE 1 CAPSULE (50,000 UNITS TOTAL) BY MOUTH EVERY 7 (SEVEN) DAYS. TAKE FOR 12 TOTAL DOSES(WEEKS) THAN CAN TRANSITION TO 1000 UNITS OTC SUPPLEMENT DAILY 12 capsule 0   No current facility-administered medications for this visit.    No Known Allergies  Diagnoses:  Adjustment d/o with mixed anx/dep Hx of SI with no plan or intentions  Plan of Care: Pt will keep a notebook to record her thoughts btwn sessions. This can also maintain her focus in psychotherapy by helping her remember important issues as she lives her life day-to-day.   Pt will try to plan walks throughout the week to help her find stillness & peace from racing thoughts. Pt will find her own way  to Study Less.  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   Donnetta Hutching, LMFT

## 2021-12-17 ENCOUNTER — Other Ambulatory Visit (HOSPITAL_BASED_OUTPATIENT_CLINIC_OR_DEPARTMENT_OTHER): Payer: Self-pay | Admitting: Obstetrics & Gynecology

## 2021-12-17 DIAGNOSIS — F419 Anxiety disorder, unspecified: Secondary | ICD-10-CM

## 2021-12-19 ENCOUNTER — Other Ambulatory Visit (HOSPITAL_BASED_OUTPATIENT_CLINIC_OR_DEPARTMENT_OTHER): Payer: Self-pay | Admitting: Nurse Practitioner

## 2021-12-19 DIAGNOSIS — E559 Vitamin D deficiency, unspecified: Secondary | ICD-10-CM

## 2021-12-23 ENCOUNTER — Ambulatory Visit (INDEPENDENT_AMBULATORY_CARE_PROVIDER_SITE_OTHER): Payer: 59 | Admitting: Nurse Practitioner

## 2021-12-23 ENCOUNTER — Encounter (HOSPITAL_BASED_OUTPATIENT_CLINIC_OR_DEPARTMENT_OTHER): Payer: Self-pay | Admitting: Nurse Practitioner

## 2021-12-23 VITALS — BP 122/89 | HR 75 | Ht 66.0 in | Wt 185.0 lb

## 2021-12-23 DIAGNOSIS — E559 Vitamin D deficiency, unspecified: Secondary | ICD-10-CM

## 2021-12-23 DIAGNOSIS — F909 Attention-deficit hyperactivity disorder, unspecified type: Secondary | ICD-10-CM

## 2021-12-23 DIAGNOSIS — D509 Iron deficiency anemia, unspecified: Secondary | ICD-10-CM | POA: Diagnosis not present

## 2021-12-23 DIAGNOSIS — R002 Palpitations: Secondary | ICD-10-CM | POA: Diagnosis not present

## 2021-12-23 DIAGNOSIS — F988 Other specified behavioral and emotional disorders with onset usually occurring in childhood and adolescence: Secondary | ICD-10-CM

## 2021-12-23 MED ORDER — LISDEXAMFETAMINE DIMESYLATE 30 MG PO CAPS: 30.0000 mg | ORAL_CAPSULE | Freq: Every day | ORAL | 0 refills | Status: DC

## 2021-12-23 NOTE — Progress Notes (Signed)
Carol Clamp, DNP, AGNP-c [facility] [address] [place], [place] [address] [phone] Office [phone] Fax  ESTABLISHED PATIENT- Chronic Health and/or Follow-Up Visit  Blood pressure 122/89, pulse 75, height 5\' 6"  (1.676 m), weight 185 lb (83.9 kg), SpO2 99 %, not currently breastfeeding.  Follow-up (Patient presents today she would B12 checked today ( labs pend). ADHD is doing good meds are working. Patient is aware she needs flu shot. )   HPI  Carol Clark  is a 26 y.o. year old female presenting today for evaluation and management of the following: Palpitations Intermittent palpitations and feeling she may "black out"  Occurring randomly over the last 2 weeks Not associated with ADHD medication Fatigue present Attention deficit disorder Well controlled with vyvanse  No difficulty with taking medication or response No side effects.  Attention and focus improved.   ROS All ROS negative with exception of what is listed in HPI  PHYSICAL EXAM Physical Exam Vitals and nursing note reviewed.  Constitutional:      Appearance: Normal appearance.  Eyes:     Extraocular Movements: Extraocular movements intact.     Pupils: Pupils are equal, round, and reactive to light.  Neck:     Vascular: No carotid bruit.  Cardiovascular:     Rate and Rhythm: Normal rate and regular rhythm.     Pulses: Normal pulses.     Heart sounds: Normal heart sounds.  Pulmonary:     Effort: Pulmonary effort is normal.     Breath sounds: Normal breath sounds.  Musculoskeletal:        General: Normal range of motion.  Skin:    General: Skin is warm and dry.  Neurological:     General: No focal deficit present.     Mental Status: She is alert and oriented to person, place, and time.     Cranial Nerves: No cranial nerve deficit.     Motor: No weakness.     Gait: Gait normal.  Psychiatric:        Mood and Affect: Mood normal.         Behavior: Behavior normal.        Thought Content: Thought content normal.        Judgment: Judgment normal.     ASSESSMENT & PLAN Problem List Items Addressed This Visit     Attention deficit disorder - Primary    Chronic. Controlled at this time with Vyvanse 30mg . No SE noted. Will send three months refills. F/U in 3 months or sooner if issues with attention/focus/medication.       Relevant Medications   lisdexamfetamine (VYVANSE) 30 MG capsule   lisdexamfetamine (VYVANSE) 30 MG capsule   lisdexamfetamine (VYVANSE) 30 MG capsule (Start on 02/17/2022)   Palpitations    Intermittent with symptoms of blacking out with unknown etiology. VS normal with no evidence of postural hypotension or tachycardia present. Labs today. Consider cardiology referral if sx continue and labs benign.       Relevant Orders   B12 and Folate Panel (Completed)   CBC With Diff/Platelet (Completed)   Iron, TIBC and Ferritin Panel (Completed)   VITAMIN D 25 Hydroxy (Vit-D Deficiency, Fractures) (Completed)   Thyroid Panel With TSH (Completed)   Comprehensive metabolic panel (Completed)   Iron deficiency anemia    Chronic. Fatigue, palpitations could indicate worsening. Labs pending. Will plan to restart fe so4 if anemia present.       Relevant Orders   CBC  With Diff/Platelet (Completed)   Iron, TIBC and Ferritin Panel (Completed)   Vitamin D deficiency    Repeat labs for monitoring. Completed 12wk tx      Relevant Orders   VITAMIN D 25 Hydroxy (Vit-D Deficiency, Fractures) (Completed)     FOLLOW-UP Return in 3 months (on 03/25/2022) for ADHD.   Carol Clamp, DNP, AGNP-c 12/23/2021 10:33 AM

## 2021-12-24 LAB — VITAMIN D 25 HYDROXY (VIT D DEFICIENCY, FRACTURES): Vit D, 25-Hydroxy: 52.8 ng/mL (ref 30.0–100.0)

## 2021-12-24 LAB — CBC WITH DIFF/PLATELET
Basophils Absolute: 0.1 10*3/uL (ref 0.0–0.2)
Basos: 1 %
EOS (ABSOLUTE): 0.1 10*3/uL (ref 0.0–0.4)
Eos: 2 %
Hematocrit: 36.8 % (ref 34.0–46.6)
Hemoglobin: 12.3 g/dL (ref 11.1–15.9)
Immature Grans (Abs): 0 10*3/uL (ref 0.0–0.1)
Immature Granulocytes: 0 %
Lymphocytes Absolute: 2.1 10*3/uL (ref 0.7–3.1)
Lymphs: 27 %
MCH: 27.9 pg (ref 26.6–33.0)
MCHC: 33.4 g/dL (ref 31.5–35.7)
MCV: 83 fL (ref 79–97)
Monocytes Absolute: 0.5 10*3/uL (ref 0.1–0.9)
Monocytes: 6 %
Neutrophils Absolute: 5 10*3/uL (ref 1.4–7.0)
Neutrophils: 64 %
Platelets: 325 10*3/uL (ref 150–450)
RBC: 4.41 x10E6/uL (ref 3.77–5.28)
RDW: 13.4 % (ref 11.7–15.4)
WBC: 7.7 10*3/uL (ref 3.4–10.8)

## 2021-12-24 LAB — COMPREHENSIVE METABOLIC PANEL
ALT: 13 IU/L (ref 0–32)
AST: 12 IU/L (ref 0–40)
Albumin/Globulin Ratio: 1.5 (ref 1.2–2.2)
Albumin: 4.1 g/dL (ref 4.0–5.0)
Alkaline Phosphatase: 63 IU/L (ref 44–121)
BUN/Creatinine Ratio: 20 (ref 9–23)
BUN: 15 mg/dL (ref 6–20)
Bilirubin Total: 0.2 mg/dL (ref 0.0–1.2)
CO2: 21 mmol/L (ref 20–29)
Calcium: 9.1 mg/dL (ref 8.7–10.2)
Chloride: 102 mmol/L (ref 96–106)
Creatinine, Ser: 0.76 mg/dL (ref 0.57–1.00)
Globulin, Total: 2.8 g/dL (ref 1.5–4.5)
Glucose: 84 mg/dL (ref 70–99)
Potassium: 4.1 mmol/L (ref 3.5–5.2)
Sodium: 136 mmol/L (ref 134–144)
Total Protein: 6.9 g/dL (ref 6.0–8.5)
eGFR: 109 mL/min/{1.73_m2} (ref >59)

## 2021-12-24 LAB — IRON,TIBC AND FERRITIN PANEL
Ferritin: 27 ng/mL (ref 15–150)
Iron Saturation: 11 % — ABNORMAL LOW (ref 15–55)
Iron: 45 ug/dL (ref 27–159)
Total Iron Binding Capacity: 417 ug/dL (ref 250–450)
UIBC: 372 ug/dL (ref 131–425)

## 2021-12-24 LAB — THYROID PANEL WITH TSH
Free Thyroxine Index: 1.9 (ref 1.2–4.9)
T3 Uptake Ratio: 21 % — ABNORMAL LOW (ref 24–39)
T4, Total: 9.2 ug/dL (ref 4.5–12.0)
TSH: 5.57 u[IU]/mL — ABNORMAL HIGH (ref 0.450–4.500)

## 2021-12-24 LAB — B12 AND FOLATE PANEL
Folate: 6 ng/mL (ref >3.0)
Vitamin B-12: 463 pg/mL (ref 232–1245)

## 2021-12-28 ENCOUNTER — Ambulatory Visit: Payer: 59 | Admitting: Behavioral Health

## 2021-12-30 ENCOUNTER — Encounter (HOSPITAL_BASED_OUTPATIENT_CLINIC_OR_DEPARTMENT_OTHER): Payer: Self-pay | Admitting: Nurse Practitioner

## 2021-12-30 DIAGNOSIS — E039 Hypothyroidism, unspecified: Secondary | ICD-10-CM

## 2021-12-30 DIAGNOSIS — D509 Iron deficiency anemia, unspecified: Secondary | ICD-10-CM

## 2022-01-03 ENCOUNTER — Ambulatory Visit (INDEPENDENT_AMBULATORY_CARE_PROVIDER_SITE_OTHER): Payer: 59 | Admitting: Behavioral Health

## 2022-01-03 DIAGNOSIS — F411 Generalized anxiety disorder: Secondary | ICD-10-CM | POA: Diagnosis not present

## 2022-01-03 NOTE — Progress Notes (Deleted)
                Nick Stults L Lauren Aguayo, LMFT 

## 2022-01-03 NOTE — Progress Notes (Signed)
[facility] Counselor/Therapist Progress Note  Patient ID: Tarrin Menn, MRN: [id number],    Date: 01/03/2022  Time Spent: 55 min of In-Person time @ [facility] Office   Treatment Type: Individual Therapy  Reported Symptoms: Pt exp'g elevated anx/frustration/anger due to upcoming NAFLEX Exam on Oct 16th. Pt has attempted several times & is doubtful she can pass, although weighing her options. Pt graduated from [employer] in May 2022 after finding out in April 2022 she was preg w/Dtr Gershon Crane.   Pt sts it has been difficult to study @ all since our last session.   Husb Greig Castilla is very supportive & encourages Pt to study. He also tries to comfort Pt, telling her they will be fine financially.   Mental Status Exam: Appearance:  Casual     Behavior: Assertive and humorous  Motor: Normal  Speech/Language:  Normal Rate  Affect: Congruent  Mood: normal  Thought process: goal directed  Thought content:   WNL  Sensory/Perceptual disturbances:   WNL  Orientation: oriented to person, place, and time/date  Attention: Good  Concentration: Good  Memory: WNL  Fund of knowledge:  Good  Insight:   Good  Judgment:  Good  Impulse Control: Good on Vyvanse, but less when she is off her medication   Risk Assessment: Danger to Self:  No Self-injurious Behavior: No Danger to Others: No Duty to Warn:no Physical Aggression / Violence:No  Access to Firearms a concern: No  Gang Involvement:No   Subjective: Pt reports she is possibly needing to initiate levothyroxine for pr-hypothyroidism per her last labwork panel w/PCP. Pt's Fr takes the same medication. She has exp'd heart palpations in the past few wks & this has not been a probl since College. She is seeking addt'l skills to control her Sx of anxiety.    Interventions: Solution-Oriented/Positive Psychology  Diagnosis:Anxiety state  Plan:   1-Take a Practice Test after you are able to study a week or two 2-Use new Study  strategies as suggested 3-Decide whether to r/s the Exam to a sooner date than Oct 16th, 2023  Pt will switch up her Study routine & try the [employer] & [employer] for some variety. She will study for one hour & then get up & do something else. Both locations offered have other activities she can mix in btwn for 30 min. She is not to study for over 4 hrs in one day. P  Pt will take a Practice Test in the next 2 wks to determine if she is confident enough to move the Exam date sooner. Then, Pt will do her best.  Cont to rely on new supportive person on Reddit who is in same situation as Pt w/her NAFLEX.  Explore the anti-anxiety apps suggested & see what works; calm.com, nopanic.uk.org, google apps & see if you can use them for a free test drive  Deneise Lever, LMFT

## 2022-02-03 MED ORDER — FERROUS SULFATE 325 (65 FE) MG PO TBEC: 325.0000 mg | DELAYED_RELEASE_TABLET | Freq: Every day | ORAL | 1 refills | Status: DC

## 2022-02-03 MED ORDER — LEVOTHYROXINE SODIUM 25 MCG PO TABS: ORAL_TABLET | ORAL | 0 refills | Status: DC

## 2022-02-03 NOTE — Assessment & Plan Note (Signed)
Intermittent with symptoms of blacking out with unknown etiology. VS normal with no evidence of postural hypotension or tachycardia present. Labs today. Consider cardiology referral if sx continue and labs benign.

## 2022-02-03 NOTE — Assessment & Plan Note (Signed)
Chronic. Controlled at this time with Vyvanse 30mg . No SE noted. Will send three months refills. F/U in 3 months or sooner if issues with attention/focus/medication.

## 2022-02-03 NOTE — Assessment & Plan Note (Signed)
Repeat labs for monitoring. Completed 12wk tx

## 2022-02-03 NOTE — Assessment & Plan Note (Signed)
Chronic. Fatigue, palpitations could indicate worsening. Labs pending. Will plan to restart fe so4 if anemia present.

## 2022-02-27 ENCOUNTER — Encounter: Payer: Self-pay | Admitting: *Deleted

## 2022-03-28 ENCOUNTER — Other Ambulatory Visit (HOSPITAL_BASED_OUTPATIENT_CLINIC_OR_DEPARTMENT_OTHER): Payer: Self-pay | Admitting: Nurse Practitioner

## 2022-03-28 DIAGNOSIS — F988 Other specified behavioral and emotional disorders with onset usually occurring in childhood and adolescence: Secondary | ICD-10-CM

## 2022-03-29 ENCOUNTER — Other Ambulatory Visit (HOSPITAL_BASED_OUTPATIENT_CLINIC_OR_DEPARTMENT_OTHER): Payer: Self-pay | Admitting: Obstetrics & Gynecology

## 2022-03-29 DIAGNOSIS — F419 Anxiety disorder, unspecified: Secondary | ICD-10-CM

## 2022-04-01 MED ORDER — LISDEXAMFETAMINE DIMESYLATE 30 MG PO CAPS: 30.0000 mg | ORAL_CAPSULE | Freq: Every day | ORAL | 0 refills | Status: DC

## 2022-04-11 ENCOUNTER — Ambulatory Visit
Admission: EM | Admit: 2022-04-11 | Discharge: 2022-04-11 | Disposition: A | Discharge status: Home / Self Care | Payer: 59 | Attending: Urgent Care | Admitting: Urgent Care

## 2022-04-11 DIAGNOSIS — J018 Other acute sinusitis: Secondary | ICD-10-CM | POA: Diagnosis present

## 2022-04-11 DIAGNOSIS — Z1152 Encounter for screening for COVID-19: Secondary | ICD-10-CM | POA: Diagnosis not present

## 2022-04-11 LAB — SARS CORONAVIRUS 2 (TAT 6-24 HRS): SARS Coronavirus 2: POSITIVE — AB

## 2022-04-11 MED ORDER — AMOXICILLIN 875 MG PO TABS: 875.0000 mg | ORAL_TABLET | Freq: Two times a day (BID) | ORAL | 0 refills | Status: DC

## 2022-04-11 NOTE — ED Triage Notes (Signed)
Pt c/o cough, URI sx, bilat LE pain and HA x 1 week-states worse x 2 days-NAD-steady gait

## 2022-04-11 NOTE — ED Provider Notes (Signed)
[facility]  Note:  This document was prepared using Conservation officer, historic buildings and may include unintentional dictation errors.  MRN: [id number] DOB: Apr 27, 1996  Subjective:   Carol Clark is a 26 y.o. female presenting for 8-9 day history of acute onset persistent and worsening bilateral ear fullness, pain, sinus congestion, sinus drainage, sinus headaches, body pains, coughing.  No chest pain, shortness of breath or wheezing.  No history of allergic rhinitis, asthma.  No smoking, vaping, marijuana use.  No current facility-administered medications for this encounter.  Current Outpatient Medications:    ferrous sulfate 325 (65 FE) MG EC tablet, Take 1 tablet (325 mg total) by mouth daily., Disp: 90 tablet, Rfl: 1   levothyroxine (SYNTHROID) 25 MCG tablet, Take 1 tablet ( ) by mouth every morning 30 minutes prior to eating. For thyroid., Disp: 90 tablet, Rfl: 0   lisdexamfetamine (VYVANSE) 30 MG capsule, Take 1 capsule (30 mg total) by mouth daily. Script 2 of 3, Disp: 30 capsule, Rfl: 0   lisdexamfetamine (VYVANSE) 30 MG capsule, Take 1 capsule (30 mg total) by mouth daily. Script 3 of 3, Disp: 30 capsule, Rfl: 0   lisdexamfetamine (VYVANSE) 30 MG capsule, Take 1 capsule (30 mg total) by mouth daily. Script 1 of 3, Disp: 30 capsule, Rfl: 0   metoprolol tartrate (LOPRESSOR) 25 MG tablet, Take 12.5 mg by mouth 2 (two) times daily as needed., Disp: , Rfl:    norgestimate-ethinyl estradiol (ORTHO-CYCLEN) 0.25-35 MG-MCG tablet, Take 1 tablet by mouth daily., Disp: 28 tablet, Rfl: 11   sertraline (ZOLOFT) 100 MG tablet, TAKE 2 TABLETS BY MOUTH EVERY DAY, Disp: 180 tablet, Rfl: 0   No Known Allergies  Past Medical History:  Diagnosis Date   ADHD    Anxiety    Insomnia 11/22/2020   Palpitations 11/22/2020   Status post vacuum-assisted vaginal delivery 04/08/2021   Delivery 04/08/21     History reviewed. No pertinent surgical history.  Family History   Problem Relation Age of Onset   Hypertension Father    Hyperlipidemia Father    Fibromyalgia Mother    Hyperlipidemia Maternal Grandmother    Hyperlipidemia Maternal Grandfather    Depression Maternal Grandfather    COPD Paternal Grandmother        pancreatic   Heart disease Paternal Grandfather    Cancer Neg Hx    Diabetes Neg Hx     Social History   Tobacco Use   Smoking status: Never   Smokeless tobacco: Never  Vaping Use   Vaping Use: Never used  Substance Use Topics   Alcohol use: Never   Drug use: Never    ROS   Objective:   Vitals: BP 104/67 (BP Location: Left Arm)   Pulse 99   Temp 99.5 F (37.5 C) (Oral)   Resp (!) 22   LMP 03/28/2022 (Approximate)   SpO2 98%   Physical Exam Constitutional:      General: She is not in acute distress.    Appearance: Normal appearance. She is well-developed and normal weight. She is not ill-appearing, toxic-appearing or diaphoretic.  HENT:     Head: Normocephalic and atraumatic.     Right Ear: Tympanic membrane, ear canal and external ear normal. No drainage or tenderness. No middle ear effusion. There is no impacted cerumen. Tympanic membrane is not erythematous or bulging.     Left Ear: Tympanic membrane, ear canal and external ear normal. No drainage or tenderness.  No middle ear effusion. There is  no impacted cerumen. Tympanic membrane is not erythematous or bulging.     Nose: Congestion and rhinorrhea present.     Mouth/Throat:     Mouth: Mucous membranes are moist. No oral lesions.     Pharynx: No pharyngeal swelling, oropharyngeal exudate, posterior oropharyngeal erythema or uvula swelling.     Tonsils: No tonsillar exudate or tonsillar abscesses.  Eyes:     General: No scleral icterus.       Right eye: No discharge.        Left eye: No discharge.     Extraocular Movements: Extraocular movements intact.     Right eye: Normal extraocular motion.     Left eye: Normal extraocular motion.     Conjunctiva/sclera:  Conjunctivae normal.  Cardiovascular:     Rate and Rhythm: Normal rate and regular rhythm.     Heart sounds: Normal heart sounds. No murmur heard.    No friction rub. No gallop.  Pulmonary:     Effort: Pulmonary effort is normal. No respiratory distress.     Breath sounds: No stridor. No wheezing, rhonchi or rales.  Chest:     Chest wall: No tenderness.  Musculoskeletal:     Cervical back: Normal range of motion and neck supple.  Lymphadenopathy:     Cervical: No cervical adenopathy.  Skin:    General: Skin is warm and dry.  Neurological:     General: No focal deficit present.     Mental Status: She is alert and oriented to person, place, and time.  Psychiatric:        Mood and Affect: Mood normal.        Behavior: Behavior normal.        Thought Content: Thought content normal.        Judgment: Judgment normal.     Assessment and Plan :   PDMP not reviewed this encounter.  1. Acute non-recurrent sinusitis of other sinus     Deferred imaging given clear cardiopulmonary exam, hemodynamically stable vital signs.  Patient requested a COVID test.  Will start empiric treatment for sinusitis with amoxicillin.  Recommended supportive care otherwise including the use of oral antihistamine, decongestant. Counseled patient on potential for adverse effects with medications prescribed/recommended today, ER and return-to-clinic precautions discussed, patient verbalized understanding.    Wallis Bamberg, [place] 04/11/22 [phone]

## 2022-04-24 ENCOUNTER — Ambulatory Visit (HOSPITAL_BASED_OUTPATIENT_CLINIC_OR_DEPARTMENT_OTHER): Payer: 59 | Admitting: Family Medicine

## 2022-04-24 ENCOUNTER — Encounter (HOSPITAL_BASED_OUTPATIENT_CLINIC_OR_DEPARTMENT_OTHER): Payer: Self-pay | Admitting: Family Medicine

## 2022-04-24 VITALS — BP 127/87 | HR 77 | Ht 66.0 in | Wt 191.4 lb

## 2022-04-24 DIAGNOSIS — F419 Anxiety disorder, unspecified: Secondary | ICD-10-CM | POA: Diagnosis not present

## 2022-04-24 DIAGNOSIS — F988 Other specified behavioral and emotional disorders with onset usually occurring in childhood and adolescence: Secondary | ICD-10-CM | POA: Diagnosis not present

## 2022-04-24 MED ORDER — BUSPIRONE HCL 5 MG PO TABS: 5.0000 mg | ORAL_TABLET | Freq: Two times a day (BID) | ORAL | 1 refills | Status: DC

## 2022-04-24 NOTE — Assessment & Plan Note (Signed)
Patient reports that she has generally been doing well with current dose of sertraline.  She does have some concerns slight progression of symptoms related to upcoming holidays and travel.  She will be traveling to [place] later this week to visit her in-laws.  She will also be traveling with her 28-year-old, this will be the first flight for her child.  She is wondering about medications which could help further control underlying anxiety/panic symptoms.  We discussed options today including augmentation of current medication regimen. After discussion, patient elected to proceed with augmentation utilizing buspirone.  We will start with 10 mg daily dose to be taken as 5 mg twice daily.  We discussed potential risk and side effects related to medication as well as potential for titration of medication in the future pending progress with medication

## 2022-04-24 NOTE — Progress Notes (Signed)
    Procedures performed today:    None.  Independent interpretation of notes and tests performed by another provider:   None.  Brief History, Exam, Impression, and Recommendations:    BP 127/87 (BP Location: Right Arm, Patient Position: Sitting, Cuff Size: Large)   Pulse 77   Ht 5\' 6"  (1.676 m)   Wt 191 lb 6.4 oz (86.8 kg)   LMP 03/28/2022 (Approximate)   SpO2 99%   BMI 30.89 kg/m   Anxiety Patient reports that she has generally been doing well with current dose of sertraline.  She does have some concerns slight progression of symptoms related to upcoming holidays and travel.  She will be traveling to [place]/30/[place] later this week to visit her in-laws.  She will also be traveling with her 3-year-old, this will be the first flight for her child.  She is wondering about medications which could help further control underlying anxiety/panic symptoms.  We discussed options today including augmentation of current medication regimen. After discussion, patient elected to proceed with augmentation utilizing buspirone.  We will start with 10 mg daily dose to be taken as 5 mg twice daily.  We discussed potential risk and side effects related to medication as well as potential for titration of medication in the future pending progress with medication  Attention deficit disorder She has been utilizing Vyvanse most recently with adequate control of symptoms.  She does report having difficulty with obtaining medication as there tends to be backorders periodically related to this medication.  As such, she has been without medication recently.  She has some questions about potential alternatives.  She has utilized Ritalin, Adderall XR in the past. We discussed considerations today.  For now, patient plans to hold off on pharmacotherapy related to this at the present time.  She would prefer to focus on better controlling anxiety symptoms and reassessing progress at that time  Return in about 4 weeks (around  05/22/2022).   ___________________________________________ Carol Clark de 05/24/2022, MD, ABFM, CAQSM [facility]

## 2022-04-24 NOTE — Assessment & Plan Note (Signed)
She has been utilizing Vyvanse most recently with adequate control of symptoms.  She does report having difficulty with obtaining medication as there tends to be backorders periodically related to this medication.  As such, she has been without medication recently.  She has some questions about potential alternatives.  She has utilized Ritalin, Adderall XR in the past. We discussed considerations today.  For now, patient plans to hold off on pharmacotherapy related to this at the present time.  She would prefer to focus on better controlling anxiety symptoms and reassessing progress at that time

## 2022-05-15 ENCOUNTER — Ambulatory Visit (HOSPITAL_BASED_OUTPATIENT_CLINIC_OR_DEPARTMENT_OTHER): Payer: Self-pay | Admitting: Family Medicine

## 2022-05-17 ENCOUNTER — Other Ambulatory Visit (HOSPITAL_BASED_OUTPATIENT_CLINIC_OR_DEPARTMENT_OTHER): Payer: Self-pay | Admitting: Family Medicine

## 2022-05-17 ENCOUNTER — Other Ambulatory Visit (HOSPITAL_BASED_OUTPATIENT_CLINIC_OR_DEPARTMENT_OTHER): Payer: Self-pay

## 2022-05-17 DIAGNOSIS — F419 Anxiety disorder, unspecified: Secondary | ICD-10-CM

## 2022-05-17 MED ORDER — BUSPIRONE HCL 5 MG PO TABS: 5.0000 mg | ORAL_TABLET | Freq: Two times a day (BID) | ORAL | 1 refills | Status: DC

## 2022-05-25 ENCOUNTER — Ambulatory Visit (HOSPITAL_BASED_OUTPATIENT_CLINIC_OR_DEPARTMENT_OTHER): Payer: 59 | Admitting: Family Medicine

## 2022-05-31 ENCOUNTER — Other Ambulatory Visit (HOSPITAL_BASED_OUTPATIENT_CLINIC_OR_DEPARTMENT_OTHER): Payer: Self-pay | Admitting: Medical

## 2022-05-31 DIAGNOSIS — Z3009 Encounter for other general counseling and advice on contraception: Secondary | ICD-10-CM

## 2022-06-01 ENCOUNTER — Encounter (HOSPITAL_BASED_OUTPATIENT_CLINIC_OR_DEPARTMENT_OTHER): Payer: Self-pay | Admitting: Family Medicine

## 2022-06-01 ENCOUNTER — Ambulatory Visit (HOSPITAL_BASED_OUTPATIENT_CLINIC_OR_DEPARTMENT_OTHER): Payer: Managed Care, Other (non HMO) | Admitting: Family Medicine

## 2022-06-01 VITALS — BP 108/87 | HR 77 | Resp 18 | Ht 66.0 in | Wt 186.0 lb

## 2022-06-01 DIAGNOSIS — F419 Anxiety disorder, unspecified: Secondary | ICD-10-CM

## 2022-06-01 NOTE — Progress Notes (Signed)
   Established Patient Office Visit  Subjective   Patient ID: Carol Clark, female    DOB: 09-30-1995  Age: 27 y.o. MRN: [id number]  Chief Complaint  Patient presents with   Anxiety   Medication Management    Buspar    HPI Presents for follow-up on new medicaiton for anxiety-  Buspar 5 mg BID. Symptoms are being managed well. Does not want to make changes. On Zoloft being managed by GYN. Does not need refills today.   ROS:  Denies fever, chill, chest pain, shortness of breath, nausea, vomiting, abdominal pain.   Objective:     BP 108/87 (BP Location: Left Arm, Patient Position: Sitting, Cuff Size: Normal)   Pulse 77   Resp 18   Ht 5\' 6"  (1.676 m)   Wt 186 lb (84.4 kg)   SpO2 97%   BMI 30.02 kg/m    Physical Exam Vitals and nursing note reviewed.  Constitutional:      General: She is not in acute distress.    Appearance: Normal appearance.  Cardiovascular:     Rate and Rhythm: Normal rate and regular rhythm.     Heart sounds: Normal heart sounds.  Pulmonary:     Effort: Pulmonary effort is normal.     Breath sounds: Normal breath sounds.  Skin:    General: Skin is warm and dry.     Capillary Refill: Capillary refill takes less than 2 seconds.  Neurological:     General: No focal deficit present.     Mental Status: She is alert. Mental status is at baseline.  Psychiatric:        Mood and Affect: Mood normal.        Behavior: Behavior normal.        Thought Content: Thought content normal.        Judgment: Judgment normal.      No results found for any visits on 06/01/22.    The ASCVD Risk score (Arnett DK, et al., 2019) failed to calculate for the following reasons:   The 2019 ASCVD risk score is only valid for ages 50 to 49    Assessment & Plan:   Problem List Items Addressed This Visit     Anxiety - Primary    Taking Buspar as prescribed. Tolerating increased dose well. Symptoms are well controlled. Does not want to make changes today. Continue  Buspar 5 mg BID. Follow-up in 6 months unless there are changes. No refills needed today.        Return in about 6 months (around 11/30/2022) for anxiety.    Chalmers Guest, FNP

## 2022-06-01 NOTE — Assessment & Plan Note (Signed)
Taking Buspar as prescribed. Tolerating increased dose well. Symptoms are well controlled. Does not want to make changes today. Continue Buspar 5 mg BID. Follow-up in 6 months unless there are changes. No refills needed today.

## 2022-06-03 ENCOUNTER — Other Ambulatory Visit (HOSPITAL_BASED_OUTPATIENT_CLINIC_OR_DEPARTMENT_OTHER): Payer: Self-pay | Admitting: Nurse Practitioner

## 2022-06-03 DIAGNOSIS — E039 Hypothyroidism, unspecified: Secondary | ICD-10-CM

## 2022-06-05 ENCOUNTER — Other Ambulatory Visit (HOSPITAL_BASED_OUTPATIENT_CLINIC_OR_DEPARTMENT_OTHER): Payer: Self-pay | Admitting: *Deleted

## 2022-06-05 DIAGNOSIS — Z3009 Encounter for other general counseling and advice on contraception: Secondary | ICD-10-CM

## 2022-06-05 MED ORDER — NORGESTIMATE-ETH ESTRADIOL 0.25-35 MG-MCG PO TABS: 1.0000 | ORAL_TABLET | Freq: Every day | ORAL | 1 refills | Status: DC

## 2022-06-25 ENCOUNTER — Other Ambulatory Visit (HOSPITAL_BASED_OUTPATIENT_CLINIC_OR_DEPARTMENT_OTHER): Payer: Self-pay | Admitting: Obstetrics & Gynecology

## 2022-06-25 DIAGNOSIS — Z3009 Encounter for other general counseling and advice on contraception: Secondary | ICD-10-CM

## 2022-06-26 NOTE — Telephone Encounter (Signed)
LMOVM for pt to call regarding refill request

## 2022-06-27 ENCOUNTER — Other Ambulatory Visit (HOSPITAL_BASED_OUTPATIENT_CLINIC_OR_DEPARTMENT_OTHER): Payer: Self-pay | Admitting: Obstetrics & Gynecology

## 2022-06-27 DIAGNOSIS — F419 Anxiety disorder, unspecified: Secondary | ICD-10-CM

## 2022-07-01 ENCOUNTER — Other Ambulatory Visit (HOSPITAL_BASED_OUTPATIENT_CLINIC_OR_DEPARTMENT_OTHER): Payer: Self-pay | Admitting: Obstetrics & Gynecology

## 2022-07-01 DIAGNOSIS — F419 Anxiety disorder, unspecified: Secondary | ICD-10-CM

## 2022-07-11 ENCOUNTER — Ambulatory Visit (HOSPITAL_BASED_OUTPATIENT_CLINIC_OR_DEPARTMENT_OTHER): Payer: Managed Care, Other (non HMO) | Admitting: Advanced Practice Midwife

## 2022-07-13 ENCOUNTER — Encounter (HOSPITAL_BASED_OUTPATIENT_CLINIC_OR_DEPARTMENT_OTHER): Payer: Self-pay | Admitting: Obstetrics & Gynecology

## 2022-07-13 ENCOUNTER — Ambulatory Visit (HOSPITAL_BASED_OUTPATIENT_CLINIC_OR_DEPARTMENT_OTHER): Payer: Managed Care, Other (non HMO) | Admitting: Obstetrics & Gynecology

## 2022-07-13 VITALS — BP 100/74 | HR 70 | Ht 66.0 in | Wt 188.8 lb

## 2022-07-13 DIAGNOSIS — Z3041 Encounter for surveillance of contraceptive pills: Secondary | ICD-10-CM

## 2022-07-13 DIAGNOSIS — Z01419 Encounter for gynecological examination (general) (routine) without abnormal findings: Secondary | ICD-10-CM | POA: Diagnosis not present

## 2022-07-13 DIAGNOSIS — F419 Anxiety disorder, unspecified: Secondary | ICD-10-CM

## 2022-07-13 DIAGNOSIS — R7989 Other specified abnormal findings of blood chemistry: Secondary | ICD-10-CM | POA: Diagnosis not present

## 2022-07-13 MED ORDER — SERTRALINE HCL 100 MG PO TABS: 200.0000 mg | ORAL_TABLET | Freq: Every morning | ORAL | 3 refills | Status: DC

## 2022-07-13 NOTE — Progress Notes (Signed)
27 y.o. G80P1001 Married White or Caucasian female here for annual exam.  Doing well.  Passed her pharmacy boards and is back with HT.  Will be in the [facility] location.  Working full time.  Daughter doing well.  She is thinking about trying again this fall.  On OCPs.  Cycles are regular.    Patient's last menstrual period was 06/26/2022.          Sexually active: Yes.    The current method of family planning is OCP (estrogen/progesterone).    Smoker:  no  Health Maintenance: Pap:  05/19/2020 ASC-US History of abnormal Pap:  no MMG:  guidelines reviewed Colonoscopy:  guidelines reviewed Screening Labs: checking thyroid today   reports that she has never smoked. She has never been exposed to tobacco smoke. She has never used smokeless tobacco. She reports that she does not drink alcohol and does not use drugs.  Past Medical History:  Diagnosis Date   ADHD    Anxiety    Insomnia 11/22/2020   Palpitations 11/22/2020   Status post vacuum-assisted vaginal delivery 04/08/2021   Delivery 04/08/21    No past surgical history on file.  Current Outpatient Medications  Medication Sig Dispense Refill   busPIRone (BUSPAR) 5 MG tablet Take 1 tablet (5 mg total) by mouth 2 (two) times daily. 90 tablet 1   ferrous sulfate 325 (65 FE) MG EC tablet Take 1 tablet (325 mg total) by mouth daily. 90 tablet 1   metoprolol tartrate (LOPRESSOR) 25 MG tablet Take 12.5 mg by mouth 2 (two) times daily as needed.     norgestimate-ethinyl estradiol (ORTHO-CYCLEN) 0.25-35 MG-MCG tablet TAKE 1 TABLET BY MOUTH EVERY DAY 84 tablet 0   sertraline (ZOLOFT) 100 MG tablet TAKE 2 TABLETS BY MOUTH EVERY DAY 180 tablet 0   No current facility-administered medications for this visit.    Family History  Problem Relation Age of Onset   Hypertension Father    Hyperlipidemia Father    Fibromyalgia Mother    Hyperlipidemia Maternal Grandmother    Hyperlipidemia Maternal Grandfather    Depression Maternal Grandfather     COPD Paternal Grandmother        pancreatic   Heart disease Paternal Grandfather    Cancer Neg Hx    Diabetes Neg Hx     ROS: Constitutional: negative Genitourinary:negative  Exam:   BP 100/74 (BP Location: Right Arm, Patient Position: Sitting, Cuff Size: Large)   Pulse 70   Ht '5\' 6"'$  (1.676 m) Comment: Reported  Wt 188 lb 12.8 oz (85.6 kg)   LMP 06/26/2022   BMI 30.47 kg/m   Height: '5\' 6"'$  (167.6 cm) (Reported)  General appearance: alert, cooperative and appears stated age Head: Normocephalic, without obvious abnormality, atraumatic Neck: no adenopathy, supple, symmetrical, trachea midline and thyroid normal to inspection and palpation Lungs: clear to auscultation bilaterally Breasts: normal appearance, no masses or tenderness Heart: regular rate and rhythm Abdomen: soft, non-tender; bowel sounds normal; no masses,  no organomegaly Extremities: extremities normal, atraumatic, no cyanosis or edema Skin: Skin color, texture, turgor normal. No rashes or lesions Lymph nodes: Cervical, supraclavicular, and axillary nodes normal. No abnormal inguinal nodes palpated Neurologic: Grossly normal   Pelvic: External genitalia:  no lesions              Urethra:  normal appearing urethra with no masses, tenderness or lesions              Bartholins and Skenes: normal  Vagina: normal appearing vagina with normal color and no discharge, no lesions              Cervix: no lesions              Pap taken: No. Bimanual Exam:  Uterus:  normal size, contour, position, consistency, mobility, non-tender              Adnexa: normal adnexa and no mass, fullness, tenderness               Rectovaginal: Confirms               Anus:  normal sphincter tone, no lesions  Chaperone, Octaviano Batty, CMA, was present for exam.  Assessment/Plan: 1. Well woman exam with routine gynecological exam - Pap smear not indicated.  Due next year. - Mammogram guidelines reviewed - Colonoscopy  guidelines reviewed - lab work ordered - vaccines reviewed/updated  2. Anxiety - sertraline (ZOLOFT) 100 MG tablet; Take 2 tablets (200 mg total) by mouth in the morning.  Dispense: 180 tablet; Refill: 3  3. Elevated TSH - TSH - T4, free  4. Encounter for surveillance of contraceptive pills

## 2022-07-14 ENCOUNTER — Telehealth (HOSPITAL_BASED_OUTPATIENT_CLINIC_OR_DEPARTMENT_OTHER): Payer: Self-pay | Admitting: Family Medicine

## 2022-07-14 LAB — T4, FREE: Free T4: 1 ng/dL (ref 0.82–1.77)

## 2022-07-14 LAB — TSH: TSH: 7.49 u[IU]/mL — ABNORMAL HIGH (ref 0.450–4.500)

## 2022-07-14 NOTE — Telephone Encounter (Signed)
Called patient to discuss levothyroxine, no answer received.  Received refill request for medication, however indication in chart that this medication was stopped.  She did have recent thyroid labs with her OB/GYN, Dr. Sabra Heck.  These labs showed elevated TSH with free T4 within normal range.  Given elevation of TSH greater than 7 and patient age, would be reasonable to treat with levothyroxine with consideration of starting at low-dose around 50 mcg daily and monitoring response with labs about 6 to 8 weeks after initiating medication.  Will attempt to reach out again to patient to discuss above.

## 2022-07-21 ENCOUNTER — Other Ambulatory Visit (HOSPITAL_BASED_OUTPATIENT_CLINIC_OR_DEPARTMENT_OTHER): Payer: Self-pay | Admitting: Family Medicine

## 2022-07-21 DIAGNOSIS — E039 Hypothyroidism, unspecified: Secondary | ICD-10-CM

## 2022-07-21 MED ORDER — LEVOTHYROXINE SODIUM 50 MCG PO TABS: 50.0000 ug | ORAL_TABLET | Freq: Every day | ORAL | 1 refills | Status: DC

## 2022-07-21 NOTE — Progress Notes (Signed)
Spoke with patient regarding recent thyroid testing which indicated continued subclinical hypothyroidism, however TSH had further increased.  She continues to be generally asymptomatic, no specific symptoms of hypothyroidism.  We discussed options and given that TSH has further increased and is now above 7 in conjunction with patient age, would be reasonable to proceed with initiating therapy with levothyroxine.  After discussion, patient elected to proceed with treatment.  Advised on taking medication regularly first in the morning prior to any other medications and on empty stomach.  We will plan for follow-up in about 2 months to assess progress medication as well as recheck TSH to assess progress medication and determine if any dose changes are needed

## 2022-07-21 NOTE — Addendum Note (Signed)
Addended by: DE [place], Rahkeem Senft J on: 07/21/2022 02:44 PM   Modules accepted: Orders

## 2022-08-29 ENCOUNTER — Other Ambulatory Visit (HOSPITAL_BASED_OUTPATIENT_CLINIC_OR_DEPARTMENT_OTHER): Payer: Self-pay | Admitting: Obstetrics & Gynecology

## 2022-09-10 ENCOUNTER — Encounter (HOSPITAL_BASED_OUTPATIENT_CLINIC_OR_DEPARTMENT_OTHER): Payer: Self-pay | Admitting: Family Medicine

## 2022-09-11 ENCOUNTER — Other Ambulatory Visit (HOSPITAL_BASED_OUTPATIENT_CLINIC_OR_DEPARTMENT_OTHER): Payer: Self-pay

## 2022-09-11 DIAGNOSIS — Z0000 Encounter for general adult medical examination without abnormal findings: Secondary | ICD-10-CM

## 2022-09-11 DIAGNOSIS — E039 Hypothyroidism, unspecified: Secondary | ICD-10-CM

## 2022-09-27 ENCOUNTER — Other Ambulatory Visit (HOSPITAL_BASED_OUTPATIENT_CLINIC_OR_DEPARTMENT_OTHER): Payer: Managed Care, Other (non HMO)

## 2022-09-28 ENCOUNTER — Other Ambulatory Visit (HOSPITAL_BASED_OUTPATIENT_CLINIC_OR_DEPARTMENT_OTHER): Payer: Managed Care, Other (non HMO)

## 2022-09-28 ENCOUNTER — Encounter (HOSPITAL_BASED_OUTPATIENT_CLINIC_OR_DEPARTMENT_OTHER): Payer: Self-pay

## 2022-09-29 LAB — LIPID PANEL
Chol/HDL Ratio: 4.3 ratio (ref 0.0–4.4)
Cholesterol, Total: 238 mg/dL — ABNORMAL HIGH (ref 100–199)
HDL: 55 mg/dL (ref >39)
LDL Chol Calc (NIH): 162 mg/dL — ABNORMAL HIGH (ref 0–99)
Triglycerides: 116 mg/dL (ref 0–149)
VLDL Cholesterol Cal: 21 mg/dL (ref 5–40)

## 2022-09-29 LAB — TSH: TSH: 3.09 u[IU]/mL (ref 0.450–4.500)

## 2022-10-05 ENCOUNTER — Encounter (HOSPITAL_BASED_OUTPATIENT_CLINIC_OR_DEPARTMENT_OTHER): Payer: Self-pay | Admitting: Family Medicine

## 2022-10-05 ENCOUNTER — Ambulatory Visit (INDEPENDENT_AMBULATORY_CARE_PROVIDER_SITE_OTHER): Payer: Managed Care, Other (non HMO) | Admitting: Family Medicine

## 2022-10-05 VITALS — BP 106/73 | HR 77 | Ht 66.0 in | Wt 189.4 lb

## 2022-10-05 DIAGNOSIS — E785 Hyperlipidemia, unspecified: Secondary | ICD-10-CM

## 2022-10-05 DIAGNOSIS — F419 Anxiety disorder, unspecified: Secondary | ICD-10-CM

## 2022-10-05 DIAGNOSIS — E039 Hypothyroidism, unspecified: Secondary | ICD-10-CM | POA: Diagnosis not present

## 2022-10-05 DIAGNOSIS — Z0000 Encounter for general adult medical examination without abnormal findings: Secondary | ICD-10-CM

## 2022-10-05 NOTE — Assessment & Plan Note (Signed)
Noted on recent labs.  She does report some family history of high cholesterol as she can recall family members who have been on cholesterol-lowering medications. We discussed recent laboratory results and general recommendations regarding management, additionally handout was provided. Plan to recheck cholesterol numbers later this year around time of physical to assess progress with lifestyle modifications

## 2022-10-05 NOTE — Progress Notes (Signed)
    Procedures performed today:    None.  Independent interpretation of notes and tests performed by another provider:   None.  Brief History, Exam, Impression, and Recommendations:    BP 106/73 (BP Location: Right Arm, Patient Position: Sitting, Cuff Size: Normal)   Pulse 77   Ht 5\' 6"  (1.676 m)   Wt 189 lb 6.4 oz (85.9 kg)   SpO2 99%   BMI 30.57 kg/m   Hypothyroidism Patient continues with levothyroxine 50 mcg dose, indicates that she has been tolerating this well.  Did have TSH checked recently and this is now within normal limits.  She still has some fatigue, however attributes this to having an 3-month old at home.  Generally, feels that she has been doing well with addition of medication.  We will continue with same dose of levothyroxine, plan to recheck thyroid function in a few months before next appointment  Anxiety Patient continues with sertraline and BuSpar.  Feels that these have been controlling symptoms quite well.  Does not have any concerns today.  We will continue with current medication regimen, no changes at this time.  Hyperlipidemia Noted on recent labs.  She does report some family history of high cholesterol as she can recall family members who have been on cholesterol-lowering medications. We discussed recent laboratory results and general recommendations regarding management, additionally handout was provided. Plan to recheck cholesterol numbers later this year around time of physical to assess progress with lifestyle modifications  Will plan for CPE in about 4 to 6 months with labs shortly before.  We will recheck TSH at that time as well as cholesterol panel.  Return in about 4 months (around 02/05/2023) for CPE with [facility] 1 week prior.   ___________________________________________ Tywan Siever de [place], MD, ABFM, CAQSM Primary Care and Sports Medicine [facility]

## 2022-10-05 NOTE — Assessment & Plan Note (Signed)
Patient continues with levothyroxine 50 mcg dose, indicates that she has been tolerating this well.  Did have TSH checked recently and this is now within normal limits.  She still has some fatigue, however attributes this to having an 5-month old at home.  Generally, feels that she has been doing well with addition of medication.  We will continue with same dose of levothyroxine, plan to recheck thyroid function in a few months before next appointment

## 2022-10-05 NOTE — Assessment & Plan Note (Signed)
Patient continues with sertraline and BuSpar.  Feels that these have been controlling symptoms quite well.  Does not have any concerns today.  We will continue with current medication regimen, no changes at this time.

## 2022-11-12 ENCOUNTER — Other Ambulatory Visit (HOSPITAL_BASED_OUTPATIENT_CLINIC_OR_DEPARTMENT_OTHER): Payer: Self-pay | Admitting: Family Medicine

## 2022-11-12 DIAGNOSIS — F419 Anxiety disorder, unspecified: Secondary | ICD-10-CM

## 2022-11-30 ENCOUNTER — Ambulatory Visit (HOSPITAL_BASED_OUTPATIENT_CLINIC_OR_DEPARTMENT_OTHER): Payer: Managed Care, Other (non HMO) | Admitting: Family Medicine

## 2022-12-29 ENCOUNTER — Encounter (HOSPITAL_BASED_OUTPATIENT_CLINIC_OR_DEPARTMENT_OTHER): Payer: Self-pay | Admitting: Family Medicine

## 2022-12-29 ENCOUNTER — Ambulatory Visit
Admission: RE | Admit: 2022-12-29 | Discharge: 2022-12-29 | Disposition: A | Discharge status: Home / Self Care | Payer: Managed Care, Other (non HMO) | Source: Ambulatory Visit | Attending: Internal Medicine | Admitting: Internal Medicine

## 2022-12-29 ENCOUNTER — Other Ambulatory Visit: Payer: Self-pay

## 2022-12-29 VITALS — BP 104/75 | HR 85 | Temp 98.2°F | Resp 16

## 2022-12-29 DIAGNOSIS — R591 Generalized enlarged lymph nodes: Secondary | ICD-10-CM

## 2022-12-29 DIAGNOSIS — H6991 Unspecified Eustachian tube disorder, right ear: Secondary | ICD-10-CM | POA: Diagnosis not present

## 2022-12-29 DIAGNOSIS — G44209 Tension-type headache, unspecified, not intractable: Secondary | ICD-10-CM | POA: Diagnosis not present

## 2022-12-29 LAB — POCT RAPID STREP A (OFFICE): Rapid Strep A Screen: NEGATIVE

## 2022-12-29 MED ORDER — CYCLOBENZAPRINE HCL 10 MG PO TABS: 10.0000 mg | ORAL_TABLET | Freq: Two times a day (BID) | ORAL | 0 refills | Status: DC | PRN

## 2022-12-29 NOTE — Discharge Instructions (Addendum)
Trial of Flexeril at night to help alleviate some your muscle tension headaches.  Please note this medication can make you drowsy.  Do not drink alcohol or drive while you are on this medication.  May continue Excedrin as needed.  I would follow-up with your PCP for possible ultrasound of your thyroid to rule out any thyroid nodules as a cause of your symptoms.  Please seek reevaluation with your PCP or return to clinic if your symptoms do not improve.  Please go to the ER for any worsening symptoms.  I hope you feel better soon!

## 2022-12-29 NOTE — ED Triage Notes (Signed)
Pt presents to UC w/ c/o headaches x2 weeks. Pt reports the right side of throat has a knot x1 week. Painful swallowing.

## 2022-12-29 NOTE — ED Provider Notes (Addendum)
[facility]    CSN: [id number] Arrival date & time: 12/29/22  0803      History   Chief Complaint Chief Complaint  Patient presents with   Sore Throat    Not a cold. Feels like there's a lump or something losged on right side of my throat. Right side of neck is tender, also causing mild right ear pain - Entered by patient    HPI Carol Clark is a 27 y.o. female presents for evaluation of headaches, right ear pain, and throat nodule.  Patient reports 2 weeks of intermittent headaches that she primarily wakes up with.  States the right sided and originate from the right trapezius travels up her right side of her neck into the right side of her head.  They typically resolve throughout the day and/or she treats them with Excedrin.  Does endorse increase in stress currently.  In addition she reports some right ear pain with occasional popping/crackling but does state this can be chronic for her.  She also reports a painful nodule to the right side of her neck for about 1 week.  States it does cause discomfort with swallowing but she denies sore throat or URI symptoms.  No globus sensation.  No fevers.  Does have a history of hypothyroidism but does not believe she has had a thyroid ultrasound in the past.  She is on levothyroxine for this and TSH normal per recent labs.  She took a COVID test yesterday that was negative.  She states overall she feels well and does not feel ill.  No other concerns at this time.   Sore Throat Associated symptoms include headaches.    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 No pertinent surgical history.  OB History     Gravida  1   Para  1   Term  1   Preterm  0   AB  0   Living  1      SAB  0   IAB  0   Ectopic  0   Multiple  0   Live Births  1            Home Medications    Prior to Admission medications   Medication Sig Start Date End Date Taking? Authorizing Provider  cyclobenzaprine (FLEXERIL) 10 MG tablet Take 1 tablet (10 mg total) by mouth 3 times/day as needed-between meals & bedtime for muscle spasms. 12/29/22  Yes Radford Pax, NP  busPIRone (BUSPAR) 5 MG tablet TAKE 1 TABLET BY MOUTH 2 TIMES A DAY 11/13/22   [place], Buren Kos, MD  levothyroxine (SYNTHROID) 50 MCG tablet Take 1 tablet (50 mcg total) by mouth daily. 07/21/22   [place], Buren Kos, MD  metoprolol tartrate (LOPRESSOR) 25 MG tablet Take 12.5 mg by mouth 2 (two) times daily as needed.  05/28/21   [provider]  norgestimate-ethinyl estradiol (SPRINTEC 28) 0.25-35 MG-MCG tablet TAKE 1 TABLET BY MOUTH DAILY 08/29/22   Jerene Bears, MD  sertraline (ZOLOFT) 100 MG tablet Take 2 tablets (200 mg total) by mouth in the morning. 07/13/22   Jerene Bears, MD    Family History Family History  Problem Relation Age of Onset   Hypertension Father    Hyperlipidemia Father    Fibromyalgia Mother    Hyperlipidemia Maternal Grandmother    Hyperlipidemia Maternal Grandfather    Depression Maternal Grandfather    COPD Paternal Grandmother        pancreatic   Heart disease Paternal Grandfather    Cancer Neg Hx    Diabetes Neg Hx     Social History Social History   Tobacco Use   Smoking status: Never    Passive exposure: Never   Smokeless tobacco: Never  Vaping Use   Vaping status:  Never Used  Substance Use Topics   Alcohol use: Never   Drug use: Never     Allergies   Patient has no known allergies.   Review of Systems Review of Systems  HENT:  Positive for ear pain.        Throat nodule  Neurological:  Positive for headaches.     Physical Exam Triage Vital Signs ED Triage Vitals  Encounter Vitals Group     BP 12/29/22 0817 104/75     Systolic BP Percentile --      Diastolic BP Percentile --      Pulse Rate 12/29/22 0817 85     Resp 12/29/22 0817 16     Temp 12/29/22 0817 98.2 F (36.8 C)     Temp Source 12/29/22 0817 Oral     SpO2 12/29/22 0817 97 %     Weight --      Height --      Head Circumference --      Peak Flow --      Pain Score 12/29/22 0821 4     Pain Loc --      Pain Education --      Exclude from Growth Chart --    No data found.  Updated Vital Signs BP 104/75 (BP Location: Right Arm)   Pulse 85   Temp 98.2 F (36.8 C) (Oral)   Resp 16   LMP 12/17/2022 (Approximate)   SpO2 97%   Visual Acuity Right Eye Distance:   Left Eye Distance:   Bilateral Distance:    Right Eye Near:   Left Eye Near:    Bilateral Near:     Physical Exam Vitals and nursing note reviewed.  Constitutional:      General: She is not in acute distress.    Appearance: Normal appearance. She is not ill-appearing.  HENT:     Head: Normocephalic and atraumatic.      Comments: Mildly tender movable nodule to the right anterior cervical chain.  No obvious erythema, warmth, swelling.    Right Ear: No drainage, swelling or tenderness. A middle ear effusion is present. Tympanic membrane is not erythematous.     Left Ear: Tympanic membrane and ear canal normal.     Mouth/Throat:     Lips: Pink.     Mouth: Mucous membranes are moist.     Pharynx: Oropharynx is clear. Uvula midline. No pharyngeal swelling, posterior oropharyngeal erythema or uvula swelling.  Eyes:     Pupils: Pupils are equal, round, and reactive to  light.  Neck:     Thyroid: No  thyromegaly or thyroid tenderness.     Comments: No tenderness with palpation to right trapezius or paracervical muscles. Cardiovascular:     Rate and Rhythm: Normal rate.  Pulmonary:     Effort: Pulmonary effort is normal.  Musculoskeletal:     Cervical back: Normal range of motion and neck supple.  Skin:    General: Skin is warm and dry.  Neurological:     General: No focal deficit present.     Mental Status: She is alert and oriented to person, place, and time.  Psychiatric:        Mood and Affect: Mood normal.        Behavior: Behavior normal.      UC Treatments / Results  Labs (all labs ordered are listed, but only abnormal results are displayed) Labs Reviewed  POCT RAPID STREP A (OFFICE)   TSH Order: [id number] Status: Final result     Visible to patient: Yes (seen)     Next appt: 01/11/2023 at 09:00 AM in Family Medicine ([facility])   0 Result Notes      Component Ref Range & Units 3 mo ago 5 mo ago 1 yr ago  TSH 0.450 - 4.500 uIU/mL 3.090 7.490 High  5.570 High   [facility]ing [facility]     EKG   Radiology No results found.  Procedures Procedures (including critical care time)  Medications Ordered in UC Medications - No data to display  Initial Impression / Assessment and Plan / UC Course  I have reviewed the triage vital signs and the nursing notes.  Pertinent labs & imaging results that were available during my care of the patient were reviewed by me and considered in my medical decision making (see chart for details).     Reviewed exam and symptoms with patient.  No red flags.  She is well-appearing with stable vital signs.  Negative rapid strep.  Discussed eustachian tube dysfunction, trial of OTC Flonase.  Reviewed likely muscle tension headaches, trial of Flexeril at night.  Side effect profile reviewed.  She can continue Excedrin as needed.  Unclear cause of nodule, lymph node versus thyroid nodule.  Advised  to monitor and follow-up with PCP for ultrasound if it does not improve.  PCP follow-up in 2 to 3 days for recheck.  ER precautions reviewed and patient verbalized understanding. Final Clinical Impressions(s) / UC Diagnoses   Final diagnoses:  Muscle tension headache  Lymphadenopathy  Eustachian tube dysfunction, right     Discharge Instructions      Trial of Flexeril at night to help alleviate some your muscle tension headaches.  Please note this medication can make you drowsy.  Do not drink alcohol or drive while you are on this medication.  May continue Excedrin as needed.  I would follow-up with your PCP for possible ultrasound of your thyroid to rule out any thyroid nodules as a cause of your symptoms.  Please seek reevaluation with your PCP or return to clinic if your symptoms do not improve.  Please go to the ER for any worsening symptoms.  I hope you feel better soon!    ED Prescriptions     Medication Sig Dispense Auth. Provider   cyclobenzaprine (FLEXERIL) 10 MG tablet Take 1 tablet (10 mg total) by mouth 3 times/day as needed-between meals & bedtime for muscle spasms. 7 tablet Radford Pax, NP      PDMP  not reviewed this encounter.   Radford Pax, NP 12/29/22 0848    Radford Pax, NP 12/29/22 [phone]

## 2023-01-01 ENCOUNTER — Ambulatory Visit (HOSPITAL_BASED_OUTPATIENT_CLINIC_OR_DEPARTMENT_OTHER): Payer: Managed Care, Other (non HMO) | Admitting: Family Medicine

## 2023-01-01 ENCOUNTER — Encounter (HOSPITAL_BASED_OUTPATIENT_CLINIC_OR_DEPARTMENT_OTHER): Payer: Self-pay | Admitting: Family Medicine

## 2023-01-01 VITALS — BP 106/72 | HR 77 | Ht 66.0 in | Wt 194.1 lb

## 2023-01-01 DIAGNOSIS — R229 Localized swelling, mass and lump, unspecified: Secondary | ICD-10-CM | POA: Insufficient documentation

## 2023-01-01 NOTE — Assessment & Plan Note (Signed)
Patient went to urgent care recently for evaluation of nodule along right side of neck.  She reports that this was noticed about 1 week ago.  Area has been tender, she has not had any other associated symptoms such as fever, chills, sweats, cough, sore throat.  At the urgent care, they discussed that they were not able to perform ultrasound at that location, but recommended that she follow-up with her PCP to consider option for imaging. On review of chart, she did have testing for strep which was negative.  Considerations were given to possible eustachian tube dysfunction, possibility of lymph node versus thyroid nodule for observed lump.  Patient does have history of hypothyroidism, recent TSH was within normal limits.  She does not have any new symptoms in this regard. On exam, slightly tender to palpation, mobile nodule along right anterior cervical chain.  No overlying skin changes, no bruising, erythema.  Oropharyngeal exam is normal.  Vital signs are normal. Discussed options, suspect reactive lymph node at this time given location and symptoms as well as duration that this has been present.  Do not feel it is related to thyroid given location of nodule.  Discussed options with patient, feel it would be reasonable to monitor over the coming weeks for possible resolution within the next 4 to 6 weeks.  Could also consider proceeding with ultrasound at this time.  Patient would prefer to proceed with ultrasound, order placed today. She does have appointment scheduled in about 1 month for physical, we can monitor progress at that time

## 2023-01-01 NOTE — Progress Notes (Signed)
    Procedures performed today:    None.  Independent interpretation of notes and tests performed by another provider:   None.  Brief History, Exam, Impression, and Recommendations:    BP 106/72   Pulse 77   Ht 5\' 6"  (1.676 m)   Wt 194 lb 1.6 oz (88 kg)   LMP 12/17/2022 (Approximate)   SpO2 99%   BMI 31.33 kg/m   Subcutaneous nodule Assessment & Plan: Patient went to urgent care recently for evaluation of nodule along right side of neck.  She reports that this was noticed about 1 week ago.  Area has been tender, she has not had any other associated symptoms such as fever, chills, sweats, cough, sore throat.  At the urgent care, they discussed that they were not able to perform ultrasound at that location, but recommended that she follow-up with her PCP to consider option for imaging. On review of chart, she did have testing for strep which was negative.  Considerations were given to possible eustachian tube dysfunction, possibility of lymph node versus thyroid nodule for observed lump.  Patient does have history of hypothyroidism, recent TSH was within normal limits.  She does not have any new symptoms in this regard. On exam, slightly tender to palpation, mobile nodule along right anterior cervical chain.  No overlying skin changes, no bruising, erythema.  Oropharyngeal exam is normal.  Vital signs are normal. Discussed options, suspect reactive lymph node at this time given location and symptoms as well as duration that this has been present.  Do not feel it is related to thyroid given location of nodule.  Discussed options with patient, feel it would be reasonable to monitor over the coming weeks for possible resolution within the next 4 to 6 weeks.  Could also consider proceeding with ultrasound at this time.  Patient would prefer to proceed with ultrasound, order placed today. She does have appointment scheduled in about 1 month for physical, we can monitor progress at that  time  Orders: -     US SOFT TISSUE HEAD & NECK (NON-THYROID); Future  Return if symptoms worsen or fail to improve.   ___________________________________________ Carol Head de [place], MD, ABFM, [facility] Primary Care and Sports Medicine [facility]

## 2023-01-02 ENCOUNTER — Ambulatory Visit
Admission: RE | Admit: 2023-01-02 | Discharge: 2023-01-02 | Disposition: A | Discharge status: Home / Self Care | Payer: Managed Care, Other (non HMO) | Source: Ambulatory Visit | Attending: Family Medicine | Admitting: Family Medicine

## 2023-01-02 DIAGNOSIS — R229 Localized swelling, mass and lump, unspecified: Secondary | ICD-10-CM

## 2023-01-11 ENCOUNTER — Other Ambulatory Visit (HOSPITAL_BASED_OUTPATIENT_CLINIC_OR_DEPARTMENT_OTHER): Payer: Managed Care, Other (non HMO)

## 2023-01-11 ENCOUNTER — Other Ambulatory Visit (HOSPITAL_BASED_OUTPATIENT_CLINIC_OR_DEPARTMENT_OTHER): Payer: Self-pay | Admitting: Family Medicine

## 2023-01-12 ENCOUNTER — Other Ambulatory Visit (HOSPITAL_BASED_OUTPATIENT_CLINIC_OR_DEPARTMENT_OTHER): Payer: Managed Care, Other (non HMO)

## 2023-01-12 ENCOUNTER — Encounter (HOSPITAL_BASED_OUTPATIENT_CLINIC_OR_DEPARTMENT_OTHER): Payer: Self-pay

## 2023-01-12 LAB — HEMOGLOBIN A1C
Est. average glucose Bld gHb Est-mCnc: 105 mg/dL
Hgb A1c MFr Bld: 5.3 % (ref 4.8–5.6)

## 2023-01-12 LAB — CBC WITH DIFFERENTIAL/PLATELET
Basophils Absolute: 0.1 10*3/uL (ref 0.0–0.2)
Basos: 1 %
EOS (ABSOLUTE): 0.1 10*3/uL (ref 0.0–0.4)
Eos: 2 %
Hematocrit: 37 % (ref 34.0–46.6)
Hemoglobin: 12.2 g/dL (ref 11.1–15.9)
Immature Grans (Abs): 0 10*3/uL (ref 0.0–0.1)
Immature Granulocytes: 0 %
Lymphocytes Absolute: 2.9 10*3/uL (ref 0.7–3.1)
Lymphs: 56 %
MCH: 27.9 pg (ref 26.6–33.0)
MCHC: 33 g/dL (ref 31.5–35.7)
MCV: 85 fL (ref 79–97)
Monocytes Absolute: 0.4 10*3/uL (ref 0.1–0.9)
Monocytes: 8 %
Neutrophils Absolute: 1.7 10*3/uL (ref 1.4–7.0)
Neutrophils: 33 %
Platelets: 269 10*3/uL (ref 150–450)
RBC: 4.38 x10E6/uL (ref 3.77–5.28)
RDW: 12.8 % (ref 11.7–15.4)
WBC: 5.2 10*3/uL (ref 3.4–10.8)

## 2023-01-12 LAB — COMPREHENSIVE METABOLIC PANEL
ALT: 16 IU/L (ref 0–32)
AST: 17 IU/L (ref 0–40)
Albumin: 4 g/dL (ref 4.0–5.0)
Alkaline Phosphatase: 51 IU/L (ref 44–121)
BUN/Creatinine Ratio: 18 (ref 9–23)
BUN: 12 mg/dL (ref 6–20)
Bilirubin Total: 0.3 mg/dL (ref 0.0–1.2)
CO2: 20 mmol/L (ref 20–29)
Calcium: 9.2 mg/dL (ref 8.7–10.2)
Chloride: 104 mmol/L (ref 96–106)
Creatinine, Ser: 0.68 mg/dL (ref 0.57–1.00)
Globulin, Total: 2.9 g/dL (ref 1.5–4.5)
Glucose: 102 mg/dL — ABNORMAL HIGH (ref 70–99)
Potassium: 4.1 mmol/L (ref 3.5–5.2)
Sodium: 137 mmol/L (ref 134–144)
Total Protein: 6.9 g/dL (ref 6.0–8.5)
eGFR: 121 mL/min/{1.73_m2} (ref >59)

## 2023-01-12 LAB — LIPID PANEL
Chol/HDL Ratio: 4 ratio (ref 0.0–4.4)
Cholesterol, Total: 239 mg/dL — ABNORMAL HIGH (ref 100–199)
HDL: 60 mg/dL (ref >39)
LDL Chol Calc (NIH): 148 mg/dL — ABNORMAL HIGH (ref 0–99)
Triglycerides: 173 mg/dL — ABNORMAL HIGH (ref 0–149)
VLDL Cholesterol Cal: 31 mg/dL (ref 5–40)

## 2023-01-12 LAB — TSH: TSH: 3.26 u[IU]/mL (ref 0.450–4.500)

## 2023-01-18 ENCOUNTER — Encounter (HOSPITAL_BASED_OUTPATIENT_CLINIC_OR_DEPARTMENT_OTHER): Payer: Self-pay | Admitting: Family Medicine

## 2023-01-18 ENCOUNTER — Ambulatory Visit (INDEPENDENT_AMBULATORY_CARE_PROVIDER_SITE_OTHER): Payer: Managed Care, Other (non HMO) | Admitting: Family Medicine

## 2023-01-18 VITALS — BP 113/78 | HR 82 | Temp 98.2°F | Ht 66.0 in | Wt 195.1 lb

## 2023-01-18 DIAGNOSIS — Z0000 Encounter for general adult medical examination without abnormal findings: Secondary | ICD-10-CM | POA: Insufficient documentation

## 2023-01-18 NOTE — Progress Notes (Signed)
Subjective:    CC: Annual Physical Exam  HPI: Carol Clark is a 27 y.o. presenting for annual physical  I reviewed the past medical history, family history, social history, surgical history, and allergies today and no changes were needed.  Please see the problem list section below in epic for further details.  Past Medical History: Past Medical History:  Diagnosis Date   ADHD    Anxiety    Insomnia 11/22/2020   Palpitations 11/22/2020   Status post vacuum-assisted vaginal delivery 04/08/2021   Delivery 04/08/21   Past Surgical History: History reviewed. No pertinent surgical history. Social History: Social History   Socioeconomic History   Marital status: Married    Spouse name: Not on file   Number of children: Not on file   Years of education: Not on file   Highest education level: Professional school degree (e.g., MD, DDS, DVM, JD)  Occupational History   Occupation: student  Tobacco Use   Smoking status: Never    Passive exposure: Never   Smokeless tobacco: Never  Vaping Use   Vaping status: Never Used  Substance and Sexual Activity   Alcohol use: Never   Drug use: Never   Sexual activity: Yes    Birth control/protection: Pill  Other Topics Concern   Not on file  Social History Narrative   Not on file   Social Determinants of Health   Financial Resource Strain: Low Risk  (12/30/2022)   Overall Financial Resource Strain (CARDIA)    Difficulty of Paying Living Expenses: Not hard at all  Food Insecurity: No Food Insecurity (12/30/2022)   Hunger Vital Sign    Worried About Running Out of Food in the Last Year: Never true    Ran Out of Food in the Last Year: Never true  Transportation Needs: No Transportation Needs (12/30/2022)   PRAPARE - Administrator, Civil Service (Medical): No    Lack of Transportation (Non-Medical): No  Physical Activity: Inactive (01/18/2023)   Exercise Vital Sign    Days of Exercise per Week: 0 days    Minutes of Exercise  per Session: 0 min  Stress: Stress Concern Present (12/30/2022)   [employer] of Occupational Health - Occupational Stress Questionnaire    Feeling of Stress : To some extent  Social Connections: Socially Integrated (12/30/2022)   Social Connection and Isolation Panel [NHANES]    Frequency of Communication with Friends and Family: More than three times a week    Frequency of Social Gatherings with Friends and Family: Three times a week    Attends Religious Services: More than 4 times per year    Active Member of Clubs or Organizations: Yes    Attends Engineer, structural: More than 4 times per year    Marital Status: Married   Family History: Family History  Problem Relation Age of Onset   Hypertension Father    Hyperlipidemia Father    Fibromyalgia Mother    Hyperlipidemia Maternal Grandmother    Hyperlipidemia Maternal Grandfather    Depression Maternal Grandfather    COPD Paternal Grandmother        pancreatic   Heart disease Paternal Grandfather    Cancer Neg Hx    Diabetes Neg Hx    Allergies: No Known Allergies Medications: See med rec.  Review of Systems: No headache, visual changes, nausea, vomiting, diarrhea, constipation, dizziness, abdominal pain, skin rash, fevers, chills, night sweats, swollen lymph nodes, weight loss, chest pain, body aches, joint swelling, muscle aches,  shortness of breath, mood changes, visual or auditory hallucinations.  Objective:    BP 113/78 (BP Location: Right Arm, Patient Position: Sitting, Cuff Size: Normal)   Pulse 82   Temp 98.2 F (36.8 C)   Ht 5\' 6"  (1.676 m)   Wt 195 lb 1.6 oz (88.5 kg)   LMP 12/17/2022 (Approximate)   SpO2 99%   BMI 31.49 kg/m   General: Well Developed, well nourished, and in no acute distress.  Neuro: Alert and oriented x3, extra-ocular muscles intact, sensation grossly intact. Cranial nerves II through XII are intact, motor, sensory, and coordinative functions are all intact. HEENT:  Normocephalic, atraumatic, pupils equal round reactive to light, neck supple, no masses, no lymphadenopathy, thyroid nonpalpable. Oropharynx, nasopharynx, external ear canals are unremarkable. Skin: Warm and dry, no rashes noted.  Cardiac: Regular rate and rhythm, no murmurs rubs or gallops.  Respiratory: Clear to auscultation bilaterally. Not using accessory muscles, speaking in full sentences.  Abdominal: Soft, nontender, nondistended, positive bowel sounds, no masses, no organomegaly.  Musculoskeletal: Shoulder, elbow, wrist, hip, knee, ankle stable, and with full range of motion.  Impression and Recommendations:    Wellness examination Assessment & Plan: Routine HCM labs reviewed. HCM reviewed/discussed. Anticipatory guidance regarding healthy weight, lifestyle and choices given. Recommend healthy diet.  Recommend approximately 150 minutes/week of moderate intensity exercise Recommend regular dental and vision exams Always use seatbelt/lap and shoulder restraints Recommend using smoke alarms and checking batteries at least twice a year Recommend using sunscreen when outside Discussed tetanus immunization recommendations, patient agreed to proceed with this today UTD with cervical cancer screening Recommend seasonal influenza vaccine, patient declines, will receive next month through work   Fatigue - patient does report ongoing fatigue, this has been for some time, not necessarily a new issue for her.  She indicates that she does get at least 6 to 8 hours of sleep per night, can be variable on whether she feels rested upon waking in the morning.  She denies any significant issues with snoring, does not feel that she has underlying issue with sleep apnea.  Current physical activity only includes light stretching. We discussed general considerations given normal labs completed recently.  Discussed possibility of meeting with sleep medicine specialist for further evaluation and to possibly  exclude underlying sleep apnea.  She declines referral at this time.  I would recommend maintaining log or diary monitoring symptoms of fatigue and how this may vary from day-to-day as well as documenting number of hours of sleep, any issues with broken sleep.  I also recommend that she gradually increase level of physical activity each week as this can also play a role in regards to fatigue/energy levels.  She will plan to follow-up as needed regarding this issue  Return in about 1 year (around 01/18/2024) for CPE.   ___________________________________________ Divine Imber de [place], MD, ABFM, CAQSM Primary Care and Sports Medicine [facility]

## 2023-01-18 NOTE — Assessment & Plan Note (Signed)
Routine HCM labs reviewed. HCM reviewed/discussed. Anticipatory guidance regarding healthy weight, lifestyle and choices given. Recommend healthy diet.  Recommend approximately 150 minutes/week of moderate intensity exercise Recommend regular dental and vision exams Always use seatbelt/lap and shoulder restraints Recommend using smoke alarms and checking batteries at least twice a year Recommend using sunscreen when outside Discussed tetanus immunization recommendations, patient agreed to proceed with this today UTD with cervical cancer screening Recommend seasonal influenza vaccine, patient declines, will receive next month through work

## 2023-01-25 ENCOUNTER — Other Ambulatory Visit (HOSPITAL_BASED_OUTPATIENT_CLINIC_OR_DEPARTMENT_OTHER): Payer: Self-pay | Admitting: Family Medicine

## 2023-02-13 ENCOUNTER — Other Ambulatory Visit (HOSPITAL_BASED_OUTPATIENT_CLINIC_OR_DEPARTMENT_OTHER): Payer: Self-pay | Admitting: Family Medicine

## 2023-02-13 DIAGNOSIS — F419 Anxiety disorder, unspecified: Secondary | ICD-10-CM

## 2023-02-19 ENCOUNTER — Encounter (HOSPITAL_BASED_OUTPATIENT_CLINIC_OR_DEPARTMENT_OTHER): Payer: Self-pay | Admitting: Family Medicine

## 2023-02-23 ENCOUNTER — Encounter (HOSPITAL_BASED_OUTPATIENT_CLINIC_OR_DEPARTMENT_OTHER): Payer: Self-pay | Admitting: Obstetrics & Gynecology

## 2023-02-26 ENCOUNTER — Ambulatory Visit (HOSPITAL_BASED_OUTPATIENT_CLINIC_OR_DEPARTMENT_OTHER): Payer: Managed Care, Other (non HMO) | Admitting: Certified Nurse Midwife

## 2023-02-26 ENCOUNTER — Encounter (HOSPITAL_BASED_OUTPATIENT_CLINIC_OR_DEPARTMENT_OTHER): Payer: Self-pay | Admitting: Certified Nurse Midwife

## 2023-02-26 VITALS — BP 99/70 | HR 75 | Ht 66.0 in | Wt 194.8 lb

## 2023-02-26 DIAGNOSIS — L292 Pruritus vulvae: Secondary | ICD-10-CM

## 2023-02-26 MED ORDER — ESTRADIOL 0.1 MG/GM VA CREA: TOPICAL_CREAM | VAGINAL | 2 refills | Status: DC

## 2023-02-26 NOTE — Progress Notes (Signed)
   GYNECOLOGY  VISIT  CC:   Carol Clark reports intermittent itching around the area of the left labia majora (between minora/majora) for the past 3 years. She had this issue even prior to pregnancy. Reports the itching seems to exacerbate r/t her period and usually exacerbates after intercourse. She hasn't noted any skin changes. No vaginal discharge or odor. No intravaginal itching. No itching around perineum or rectal region. She bottlefed her daughter who will be 29 in December.   HPI: 27 y.o. G69P1001 Married White or Caucasian female here for above.   Past Medical History:  Diagnosis Date   ADHD    Anxiety    Insomnia 11/22/2020   Palpitations 11/22/2020   Status post vacuum-assisted vaginal delivery 04/08/2021   Delivery 04/08/21    MEDS:   Current Outpatient Medications on File Prior to Visit  Medication Sig Dispense Refill   busPIRone (BUSPAR) 5 MG tablet TAKE 1 TABLET BY MOUTH 2 TIMES A DAY 90 tablet 1   levothyroxine (SYNTHROID) 50 MCG tablet TAKE 1 TABLET BY MOUTH DAILY 90 tablet 1   metoprolol tartrate (LOPRESSOR) 25 MG tablet Take 12.5 mg by mouth 2 (two) times daily as needed.     norgestimate-ethinyl estradiol (SPRINTEC 28) 0.25-35 MG-MCG tablet TAKE 1 TABLET BY MOUTH DAILY 84 tablet 3   sertraline (ZOLOFT) 100 MG tablet Take 2 tablets (200 mg total) by mouth in the morning. 180 tablet 3   No current facility-administered medications on file prior to visit.    ALLERGIES: Patient has no known allergies.  SH:  Lives with spouse and healthy daughter  ROS  PHYSICAL EXAMINATION:    BP 99/70 (BP Location: Left Arm, Patient Position: Sitting, Cuff Size: Normal)   Pulse 75   Ht 5\' 6"  (1.676 m)   Wt 194 lb 12.8 oz (88.4 kg)   BMI 31.44 kg/m     General appearance: alert, cooperative and appears stated age  Pelvic: External genitalia:  no lesions, no skin changes noted              Urethra:  normal appearing urethra with no masses, tenderness or lesions               Bartholins and Skenes: normal                 Vagina: normal mucosa without prolapse or lesions and normal without tenderness, induration or masses                      Anus:  normal sphincter tone, no lesions  Assessment/Plan: 1. Vulvar itching (chronic) Discussed options for management. Will start with trial of Estradiol vaginal cream fingertip amount twice daily for 2 weeks and then one application twice weekly. Pt will monitor her symptoms until end of November. Discussed there is potential that itching reflects Lichen Sclerosus although no current findings noted upon assessment that would be c/w Lichen Sclerosus (however, pt does report 3 year history of intermittent itching in this area).  - estradiol (ESTRACE) 0.1 MG/GM vaginal cream; Fingertip amount to affected area twice daily for 14 days then decrease to one application twice weekly  Dispense: 42.5 g; Refill: 2   Follow-up here for annual gyn exam as scheduled.  Carol Clark

## 2023-04-19 ENCOUNTER — Telehealth: Payer: Managed Care, Other (non HMO) | Admitting: Physician Assistant

## 2023-04-19 DIAGNOSIS — J069 Acute upper respiratory infection, unspecified: Secondary | ICD-10-CM

## 2023-04-19 MED ORDER — BENZONATATE 100 MG PO CAPS: 100.0000 mg | ORAL_CAPSULE | Freq: Three times a day (TID) | ORAL | 0 refills | Status: DC | PRN

## 2023-04-19 MED ORDER — IPRATROPIUM BROMIDE 0.03 % NA SOLN: 2.0000 | Freq: Two times a day (BID) | NASAL | 0 refills | Status: DC

## 2023-04-19 NOTE — Progress Notes (Signed)
I have spent 5 minutes in review of e-visit questionnaire, review and updating patient chart, medical decision making and response to patient.   Mia Milan Cody Jacklynn Dehaas, PA-C    

## 2023-04-19 NOTE — Progress Notes (Signed)
E-Visit for Upper Respiratory Infection  ° °We are sorry you are not feeling well.  Here is how we plan to help! ° °Based on what you have shared with me, it looks like you may have a viral upper respiratory infection.  Upper respiratory infections are caused by a large number of viruses; however, rhinovirus is the most common cause.  ° °Symptoms vary from person to person, with common symptoms including sore throat, cough, fatigue or lack of energy and feeling of general discomfort.  A low-grade fever of up to 100.4 may present, but is often uncommon.  Symptoms vary however, and are closely related to a person's age or underlying illnesses.  The most common symptoms associated with an upper respiratory infection are nasal discharge or congestion, cough, sneezing, headache and pressure in the ears and face.  These symptoms usually persist for about 3 to 10 days, but can last up to 2 weeks.  It is important to know that upper respiratory infections do not cause serious illness or complications in most cases.   ° °Upper respiratory infections can be transmitted from person to person, with the most common method of transmission being a person's hands.  The virus is able to live on the skin and can infect other persons for up to 2 hours after direct contact.  Also, these can be transmitted when someone coughs or sneezes; thus, it is important to cover the mouth to reduce this risk.  To keep the spread of the illness at bay, good hand hygiene is very important. ° °This is an infection that is most likely caused by a virus. There are no specific treatments other than to help you with the symptoms until the infection runs its course.  We are sorry you are not feeling well.  Here is how we plan to help! ° ° °For nasal congestion, you may use an oral decongestants such as Mucinex D or if you have glaucoma or high blood pressure use plain Mucinex.  Saline nasal spray or nasal drops can help and can safely be used as often as  needed for congestion.  For your congestion, I have prescribed Ipratropium Bromide nasal spray 0.03% two sprays in each nostril 2-3 times a day ° °If you do not have a history of heart disease, hypertension, diabetes or thyroid disease, prostate/bladder issues or glaucoma, you may also use Sudafed to treat nasal congestion.  It is highly recommended that you consult with a pharmacist or your primary care physician to ensure this medication is safe for you to take.    ° °If you have a cough, you may use cough suppressants such as Delsym and Robitussin.  If you have glaucoma or high blood pressure, you can also use Coricidin HBP.   °For cough I have prescribed for you A prescription cough medication called Tessalon Perles 100 mg. You may take 1-2 capsules every 8 hours as needed for cough ° °If you have a sore or scratchy throat, use a saltwater gargle- ¼ to ½ teaspoon of salt dissolved in a 4-ounce to 8-ounce glass of warm water.  Gargle the solution for approximately 15-30 seconds and then spit.  It is important not to swallow the solution.  You can also use throat lozenges/cough drops and Chloraseptic spray to help with throat pain or discomfort.  Warm or cold liquids can also be helpful in relieving throat pain. ° °For headache, pain or general discomfort, you can use Ibuprofen or Tylenol as directed.   °  Some authorities believe that zinc sprays or the use of Echinacea may shorten the course of your symptoms. ° ° °HOME CARE °Only take medications as instructed by your medical team. °Be sure to drink plenty of fluids. Water is fine as well as fruit juices, sodas and electrolyte beverages. You may want to stay away from caffeine or alcohol. If you are nauseated, try taking small sips of liquids. How do you know if you are getting enough fluid? Your urine should be a pale yellow or almost colorless. °Get rest. °Taking a steamy shower or using a humidifier may help nasal congestion and ease sore throat pain. You can  place a towel over your head and breathe in the steam from hot water coming from a faucet. °Using a saline nasal spray works much the same way. °Cough drops, hard candies and sore throat lozenges may ease your cough. °Avoid close contacts especially the very young and the elderly °Cover your mouth if you cough or sneeze °Always remember to wash your hands.  ° °GET HELP RIGHT AWAY IF: °You develop worsening fever. °If your symptoms do not improve within 10 days °You develop yellow or green discharge from your nose over 3 days. °You have coughing fits °You develop a severe head ache or visual changes. °You develop shortness of breath, difficulty breathing or start having chest pain °Your symptoms persist after you have completed your treatment plan ° °MAKE SURE YOU  °Understand these instructions. °Will watch your condition. °Will get help right away if you are not doing well or get worse. ° °Thank you for choosing an e-visit. ° °Your e-visit answers were reviewed by a board certified advanced clinical practitioner to complete your personal care plan. Depending upon the condition, your plan could have included both over the counter or prescription medications. ° °Please review your pharmacy choice. Make sure the pharmacy is open so you can pick up prescription now. If there is a problem, you may contact your provider through MyChart messaging and have the prescription routed to another pharmacy.  Your safety is important to us. If you have drug allergies check your prescription carefully.  ° °For the next 24 hours you can use MyChart to ask questions about today's visit, request a non-urgent call back, or ask for a work or school excuse. °You will get an email in the next two days asking about your experience. I hope that your e-visit has been valuable and will speed your recovery. ° °

## 2023-05-09 NOTE — L&D Delivery Note (Signed)
 Delivery Note At 5:25 PM a {baby status:3041425} female was delivered via Vaginal, Spontaneous (Presentation: Left Occiput Anterior).  APGAR: 4, 8; weight 7 lb 7.2 oz (3380 g).   Placenta status: Spontaneous, Intact.  Cord: 3 vessels with the following complications: None.  Cord pH: ***  Anesthesia: Epidural Episiotomy: None Lacerations: 2nd degree Suture Repair: {suture ubez:[id number]} Est. Blood Loss (mL): 120  Mom to {mom status:3041454}.  Baby to {baby status:3041455}.  Carol Clark  Carol Clark 05/04/2024, 6:36 PM

## 2023-05-13 ENCOUNTER — Other Ambulatory Visit (HOSPITAL_BASED_OUTPATIENT_CLINIC_OR_DEPARTMENT_OTHER): Payer: Self-pay | Admitting: Family Medicine

## 2023-05-13 DIAGNOSIS — F419 Anxiety disorder, unspecified: Secondary | ICD-10-CM

## 2023-06-27 ENCOUNTER — Ambulatory Visit (HOSPITAL_BASED_OUTPATIENT_CLINIC_OR_DEPARTMENT_OTHER): Payer: Managed Care, Other (non HMO) | Admitting: Certified Nurse Midwife

## 2023-06-27 VITALS — BP 104/84 | HR 79 | Ht 66.0 in | Wt 197.8 lb

## 2023-06-27 DIAGNOSIS — N926 Irregular menstruation, unspecified: Secondary | ICD-10-CM | POA: Diagnosis not present

## 2023-06-27 LAB — POCT URINE PREGNANCY: Preg Test, Ur: NEGATIVE

## 2023-06-27 NOTE — Progress Notes (Signed)
  GYNECOLOGY  VISIT  CC:   Her periods are typically monthly and regular. They started trying to conceive approx 6 weeks ago. Her LMP was 05/16/23. Pt did home urine pregnancy test which was negative, but would like to make sure that she is not pregnant. It is unusual for her to skip a period. She does have Hypothyroidism, last TSH approx 5 months ago, taking Synthroid daily. Will check TSH today and adjust Synthroid if needed. They have a cruise planned in May.   HPI: 28 y.o. G73P1001 Married White or Caucasian female here for Missed Period.   Past Medical History:  Diagnosis Date   ADHD    Anxiety    Insomnia 11/22/2020   Palpitations 11/22/2020   Status post vacuum-assisted vaginal delivery 04/08/2021   Delivery 04/08/21    MEDS:   Current Outpatient Medications on File Prior to Visit  Medication Sig Dispense Refill   busPIRone (BUSPAR) 5 MG tablet TAKE 1 TABLET BY MOUTH 2 TIMES A DAY 90 tablet 1   estradiol (ESTRACE) 0.1 MG/GM vaginal cream Fingertip amount to affected area twice daily for 14 days then decrease to one application twice weekly 42.5 g 2   levothyroxine (SYNTHROID) 50 MCG tablet TAKE 1 TABLET BY MOUTH DAILY 90 tablet 1   metoprolol tartrate (LOPRESSOR) 25 MG tablet Take 12.5 mg by mouth 2 (two) times daily as needed.     sertraline (ZOLOFT) 100 MG tablet Take 2 tablets (200 mg total) by mouth in the morning. 180 tablet 3   No current facility-administered medications on file prior to visit.    ALLERGIES: Patient has no known allergies.  SH:  lives w/ supportive spouse and healthy 27year old daughter  Review of Systems  Constitutional: Negative.   Genitourinary:        Missed one period   PHYSICAL EXAMINATION:    BP 104/84 (BP Location: Right Arm, Patient Position: Sitting)   Pulse 79   Ht 5\' 6"  (1.676 m)   Wt 197 lb 12.8 oz (89.7 kg)   LMP 05/16/2023   BMI 31.93 kg/m     General appearance: alert, cooperative and appears stated  age  Assessment/Plan: 1. Missed period (Primary) - Isolated missed period x 1, trying to conceive x 6 weeks - POCT urine pregnancy - TSH - Beta hCG quant (ref lab) - Pending TSH, may make adjustment to Synthroid since pt desiring fertility at this time (missed period and 9lb recent weight gain).  Letta Kocher

## 2023-06-28 ENCOUNTER — Encounter (HOSPITAL_BASED_OUTPATIENT_CLINIC_OR_DEPARTMENT_OTHER): Payer: Self-pay | Admitting: Family Medicine

## 2023-06-28 ENCOUNTER — Encounter (HOSPITAL_BASED_OUTPATIENT_CLINIC_OR_DEPARTMENT_OTHER): Payer: Self-pay | Admitting: Certified Nurse Midwife

## 2023-06-28 LAB — BETA HCG QUANT (REF LAB): hCG Quant: <1 m[IU]/mL

## 2023-06-28 LAB — TSH: TSH: 0.131 u[IU]/mL — ABNORMAL LOW (ref 0.450–4.500)

## 2023-07-25 ENCOUNTER — Other Ambulatory Visit (HOSPITAL_BASED_OUTPATIENT_CLINIC_OR_DEPARTMENT_OTHER): Payer: Self-pay | Admitting: Obstetrics & Gynecology

## 2023-07-25 ENCOUNTER — Other Ambulatory Visit (HOSPITAL_BASED_OUTPATIENT_CLINIC_OR_DEPARTMENT_OTHER): Payer: Self-pay | Admitting: Family Medicine

## 2023-07-25 DIAGNOSIS — F419 Anxiety disorder, unspecified: Secondary | ICD-10-CM

## 2023-07-26 ENCOUNTER — Ambulatory Visit (HOSPITAL_BASED_OUTPATIENT_CLINIC_OR_DEPARTMENT_OTHER): Payer: Managed Care, Other (non HMO) | Admitting: Obstetrics & Gynecology

## 2023-07-26 ENCOUNTER — Encounter (HOSPITAL_BASED_OUTPATIENT_CLINIC_OR_DEPARTMENT_OTHER): Payer: Self-pay | Admitting: Obstetrics & Gynecology

## 2023-07-26 ENCOUNTER — Other Ambulatory Visit (HOSPITAL_COMMUNITY)
Admission: RE | Admit: 2023-07-26 | Discharge: 2023-07-26 | Disposition: A | Discharge status: Home / Self Care | Source: Ambulatory Visit | Attending: Obstetrics & Gynecology | Admitting: Obstetrics & Gynecology

## 2023-07-26 VITALS — BP 120/74 | HR 79 | Ht 65.0 in | Wt 196.4 lb

## 2023-07-26 DIAGNOSIS — Z01419 Encounter for gynecological examination (general) (routine) without abnormal findings: Secondary | ICD-10-CM

## 2023-07-26 DIAGNOSIS — E039 Hypothyroidism, unspecified: Secondary | ICD-10-CM | POA: Diagnosis not present

## 2023-07-26 DIAGNOSIS — Z124 Encounter for screening for malignant neoplasm of cervix: Secondary | ICD-10-CM | POA: Insufficient documentation

## 2023-07-26 DIAGNOSIS — F419 Anxiety disorder, unspecified: Secondary | ICD-10-CM

## 2023-07-26 MED ORDER — SERTRALINE HCL 100 MG PO TABS
200.0000 mg | ORAL_TABLET | Freq: Every morning | ORAL | 3 refills | Status: AC
  Filled 2024-05-12: qty 180, 90d supply, fill #0

## 2023-07-26 NOTE — Progress Notes (Signed)
 ANNUAL EXAM Patient name: Carol Clark MRN [id number]  Date of birth: 1995/07/13 Chief Complaint:   AEX  History of Present Illness:   Carol Clark is a 28 y.o. G9P1001 Caucasian female being seen today for a routine annual exam.  Daughter is doing well.  They have started to try for pregnancy again but on hold at the moment because they have a cruise in May.  Will start trying again after that time.    Stopped birth in January.  Cycles have not been perfectly normal.  Using condoms.    TSH was low about a month ago.  Dosage adjustment suggested.  Wasn't sent in to pharmacy.    Patient's last menstrual period was 07/03/2023.  Last pap 05/19/2020. Results were: ASC-H w/ HRHPV negative. H/O abnormal pap: no Last mammogram: guidelines reviewed. Family h/o breast cancer: no Last colonoscopy: guidelines reviewed. Family h/o colorectal cancer: no     07/26/2023    1:51 PM 02/26/2023    1:28 PM 01/18/2023    1:32 PM 01/01/2023   10:07 AM 10/05/2022    9:16 AM  Depression screen PHQ 2/9  Decreased Interest 0 0 1 0 0  Down, Depressed, Hopeless 0 0 1 0 0  PHQ - 2 Score 0 0 2 0 0  Altered sleeping   2 2   Tired, decreased energy   3 3   Change in appetite   1 0   Feeling bad or failure about yourself    0 0   Trouble concentrating   1 0   Moving slowly or fidgety/restless   0 0   Suicidal thoughts   0 0   PHQ-9 Score   9 5   Difficult doing work/chores   Somewhat difficult Somewhat difficult         01/18/2023    1:33 PM 01/01/2023   10:07 AM 10/05/2022    9:16 AM 06/01/2022    2:36 PM  GAD 7 : Generalized Anxiety Score  Nervous, Anxious, on Edge 1 1 0 0  Control/stop worrying 0 0 0 0  Worry too much - different things 0 1 0 0  Trouble relaxing 2 2 0 1  Restless 0 0 0 0  Easily annoyed or irritable 1 2 0 0  Afraid - awful might happen 0 0 0 0  Total GAD 7 Score 4 6 0 1  Anxiety Difficulty Somewhat difficult Somewhat difficult Not difficult at all Not difficult at all      Review of Systems:   Pertinent items are noted in HPI  Denies abnormal vaginal discharge, pelvic pain and abdominal pain, breast issues, urinary concerns. Pertinent History Reviewed:  Reviewed past medical,surgical, social and family history.  Reviewed problem list, medications and allergies. Physical Assessment:   Vitals:   07/26/23 1348  BP: 120/74  Pulse: 79  Weight: 196 lb 6.4 oz (89.1 kg)  Height: 5\' 5"  (1.651 m)  Body mass index is 32.68 kg/m.        Physical Examination:   General appearance - well appearing, and in no distress  Mental status - alert, oriented to person, place, and time  Psych:  She has a normal mood and affect  Skin - warm and dry, normal color, no suspicious lesions noted  Chest - effort normal, all lung fields clear to auscultation bilaterally  Heart - normal rate and regular rhythm  Neck:  midline trachea, no thyromegaly or nodules  Breasts - breasts appear normal, no suspicious masses,  no skin or nipple changes or  axillary nodes  Abdomen - soft, nontender, nondistended, no masses or organomegaly  Pelvic - VULVA: normal appearing vulva with no masses, tenderness or lesions  VAGINA: normal appearing vagina with normal color and discharge, no lesions  CERVIX: normal appearing cervix without discharge or lesions, no CMT  Thin prep pap is done without HR HPV cotesting  UTERUS: uterus is felt to be normal size, shape, consistency and nontender   ADNEXA: No adnexal masses or tenderness noted.  Rectal -deferred  Extremities:  No swelling or varicosities noted  Chaperone present for exam  Assessment & Plan:  1. Well woman exam with routine gynecological exam (Primary) - Pap smear obtained today - Mammogram guidelines reviewed - Colonoscopy guidelines reviewed - vaccines reviewed/updated  2. Cervical cancer screening - Cytology - PAP( [place])  3. Acquired hypothyroidism - TSH - T4, free  4. Anxiety - sertraline (ZOLOFT) 100 MG  tablet; Take 2 tablets (200 mg total) by mouth in the morning.  Dispense: 180 tablet; Refill: 3   Meds:  Meds ordered this encounter  Medications   sertraline (ZOLOFT) 100 MG tablet    Sig: Take 2 tablets (200 mg total) by mouth in the morning.    Dispense:  180 tablet    Refill:  3    Follow-up: Return in about 1 year (around 07/25/2024).  Jerene Bears, MD 07/26/2023 2:43 PM

## 2023-07-27 ENCOUNTER — Encounter (HOSPITAL_BASED_OUTPATIENT_CLINIC_OR_DEPARTMENT_OTHER): Payer: Self-pay | Admitting: Obstetrics & Gynecology

## 2023-07-27 LAB — TSH: TSH: 0.545 u[IU]/mL (ref 0.450–4.500)

## 2023-07-27 LAB — T4, FREE: Free T4: 0.91 ng/dL (ref 0.82–1.77)

## 2023-07-31 LAB — CYTOLOGY - PAP: Diagnosis: NEGATIVE

## 2023-08-10 ENCOUNTER — Other Ambulatory Visit (HOSPITAL_BASED_OUTPATIENT_CLINIC_OR_DEPARTMENT_OTHER): Payer: Self-pay | Admitting: Family Medicine

## 2023-08-10 DIAGNOSIS — F419 Anxiety disorder, unspecified: Secondary | ICD-10-CM

## 2023-08-27 ENCOUNTER — Other Ambulatory Visit (HOSPITAL_BASED_OUTPATIENT_CLINIC_OR_DEPARTMENT_OTHER): Payer: Self-pay | Admitting: *Deleted

## 2023-08-27 DIAGNOSIS — O3680X0 Pregnancy with inconclusive fetal viability, not applicable or unspecified: Secondary | ICD-10-CM

## 2023-08-27 NOTE — Progress Notes (Signed)
 Pt with 2 positive home pregnancy tests. Provided with appt for ultrasound

## 2023-09-07 ENCOUNTER — Encounter (HOSPITAL_BASED_OUTPATIENT_CLINIC_OR_DEPARTMENT_OTHER): Payer: Self-pay | Admitting: Obstetrics & Gynecology

## 2023-09-07 ENCOUNTER — Other Ambulatory Visit (HOSPITAL_BASED_OUTPATIENT_CLINIC_OR_DEPARTMENT_OTHER): Payer: Self-pay | Admitting: Obstetrics & Gynecology

## 2023-09-07 DIAGNOSIS — R110 Nausea: Secondary | ICD-10-CM

## 2023-09-07 MED ORDER — SCOPOLAMINE 1 MG/3DAYS TD PT72: 1.0000 | MEDICATED_PATCH | TRANSDERMAL | 0 refills | Status: DC

## 2023-09-26 ENCOUNTER — Encounter (HOSPITAL_BASED_OUTPATIENT_CLINIC_OR_DEPARTMENT_OTHER): Payer: Self-pay | Admitting: Obstetrics & Gynecology

## 2023-09-26 ENCOUNTER — Ambulatory Visit (HOSPITAL_BASED_OUTPATIENT_CLINIC_OR_DEPARTMENT_OTHER): Admitting: Obstetrics & Gynecology

## 2023-09-26 ENCOUNTER — Ambulatory Visit (HOSPITAL_BASED_OUTPATIENT_CLINIC_OR_DEPARTMENT_OTHER)

## 2023-09-26 ENCOUNTER — Encounter (HOSPITAL_BASED_OUTPATIENT_CLINIC_OR_DEPARTMENT_OTHER): Payer: Self-pay

## 2023-09-26 DIAGNOSIS — O3680X0 Pregnancy with inconclusive fetal viability, not applicable or unspecified: Secondary | ICD-10-CM | POA: Diagnosis not present

## 2023-09-26 DIAGNOSIS — Z3A01 Less than 8 weeks gestation of pregnancy: Secondary | ICD-10-CM | POA: Diagnosis not present

## 2023-09-26 DIAGNOSIS — Z3A08 8 weeks gestation of pregnancy: Secondary | ICD-10-CM

## 2023-09-26 DIAGNOSIS — O219 Vomiting of pregnancy, unspecified: Secondary | ICD-10-CM

## 2023-09-26 DIAGNOSIS — R110 Nausea: Secondary | ICD-10-CM

## 2023-09-26 MED ORDER — SCOPOLAMINE 1 MG/3DAYS TD PT72
1.0000 | MEDICATED_PATCH | TRANSDERMAL | 2 refills | Status: DC
Start: 2023-09-26 — End: 2023-10-16

## 2023-09-27 ENCOUNTER — Ambulatory Visit
Admission: RE | Admit: 2023-09-27 | Discharge: 2023-09-27 | Disposition: A | Discharge status: Home / Self Care | Source: Ambulatory Visit | Attending: Physician Assistant | Admitting: Physician Assistant

## 2023-09-27 ENCOUNTER — Other Ambulatory Visit: Payer: Self-pay

## 2023-09-27 VITALS — BP 104/73 | HR 88 | Temp 98.1°F | Resp 20 | Ht 65.0 in | Wt 195.0 lb

## 2023-09-27 DIAGNOSIS — J069 Acute upper respiratory infection, unspecified: Secondary | ICD-10-CM | POA: Diagnosis not present

## 2023-09-27 DIAGNOSIS — R062 Wheezing: Secondary | ICD-10-CM | POA: Diagnosis not present

## 2023-09-27 DIAGNOSIS — R0602 Shortness of breath: Secondary | ICD-10-CM

## 2023-09-27 LAB — POC COVID19/FLU A&B COMBO
Covid Antigen, POC: NEGATIVE
Influenza A Antigen, POC: NEGATIVE
Influenza B Antigen, POC: NEGATIVE

## 2023-09-27 MED ORDER — IPRATROPIUM-ALBUTEROL 0.5-2.5 (3) MG/3ML IN SOLN
3.0000 mL | Freq: Once | RESPIRATORY_TRACT | Status: AC
  Administered 2023-09-27: 3 mL via RESPIRATORY_TRACT

## 2023-09-27 MED ORDER — AMOXICILLIN-POT CLAVULANATE 875-125 MG PO TABS: 1.0000 | ORAL_TABLET | Freq: Two times a day (BID) | ORAL | 0 refills | Status: DC

## 2023-09-27 MED ORDER — AZITHROMYCIN 250 MG PO TABS: ORAL_TABLET | ORAL | 0 refills | Status: AC

## 2023-09-27 MED ORDER — ALBUTEROL SULFATE HFA 108 (90 BASE) MCG/ACT IN AERS: 1.0000 | INHALATION_SPRAY | Freq: Four times a day (QID) | RESPIRATORY_TRACT | 0 refills | Status: DC | PRN

## 2023-09-27 NOTE — Discharge Instructions (Signed)
 You were seen today for concerns of productive coughing, fatigue, nasal congestion and ear fullness as well as shortness of breath.  At this time I am suspicious that you may have an upper respiratory tract infection but I cannot rule out potential pneumonia especially given your wheezing and breathing concerns.  We have administered a DuoNeb treatment here in the clinic to assist with your breathing which seem to help improve your airflow.  I have also sent in an albuterol inhaler for you to use up to every 6 hours as needed for shortness of breath and coughing.  This may cause some rapid heart beating and shakiness but this is a normal reaction to this medication. Given the concern for potential pneumonia I am sending you in a medication regimen comprised of 2 antibiotics.  1 is a medication called Augmentin to be taken twice per day for 7 days.  The other is a medication called azithromycin also known as a Z-Pak.  The Z-Pak should help with pulmonary inflammation and help you breathe a little bit easier in addition to treating a bacterial infection. Please continue to take over-the-counter medications as recommended by your OB/GYN for symptom management.  It is okay for you to take Tylenol  as needed for pain and fever.  You can also take regular formulations of Robitussin, Mucinex and Flonase as needed for symptom management.  If at any point you feel like you are having fevers or chills that are not responding to Tylenol , difficulty breathing, coughing, chest tightness or pain, confusion, passing out please go to the emergency room as these could be signs of medical emergency.

## 2023-09-27 NOTE — ED Triage Notes (Signed)
 Pt presents with complaints of cough, "rattling in chest", and shortness of breath x 1 week however has become worse over the last two days. Denies fevers at home. Pt states she did just return from a cruise on Sunday 5/18. Pt currently rates her overall pain a 2/10. Pt is [redacted] weeks pregnant. Claritin taken yesterday with no improvement.

## 2023-09-27 NOTE — ED Provider Notes (Signed)
 [facility]    CSN: [id number] Arrival date & time: 09/27/23  4098      History   Chief Complaint Chief Complaint  Patient presents with   Cough    Nasal congestion, productive cough. Pregnant - Entered by patient    HPI Carol Clark is a 28 y.o. female.   HPI  She reports concerns for coughing that is mainly worse at night and keeping her up at night She reports nasal congestion, and concerns for chest congestion She states this has been ongoing for about a week but has gotten worse over the past 2 days She reports some ear pressure as well  She reports she was on a cruise with her family last week and her nephew was sick with similar symptoms and then her daughter started to develop them shortly after Interventions: Claritin. She was not sure what else to take due to pregnancy   Past Medical History:  Diagnosis Date   ADHD    Anxiety    Insomnia 11/22/2020   Palpitations 11/22/2020   Status post vacuum-assisted vaginal delivery 04/08/2021   Delivery 04/08/21    Patient Active Problem List   Diagnosis Date Noted   Wellness examination 01/18/2023   Subcutaneous nodule 01/01/2023   Hypothyroidism 10/05/2022   Hyperlipidemia 10/05/2022   Mixed anxiety and depressive disorder 07/06/2021   Iron deficiency anemia 07/06/2021   Vitamin D  deficiency 07/06/2021   Atypical squamous cell changes of undetermined significance (ASCUS) on cervical cytology with negative high risk human papilloma virus (HPV) test result 05/13/2021   Palpitations 11/22/2020   Anxiety 08/26/2019   Attention deficit disorder 01/27/2016    History reviewed. No pertinent surgical history.  OB History     Gravida  2   Para  1   Term  1   Preterm  0   AB  0   Living  1      SAB  0   IAB  0   Ectopic  0   Multiple  0   Live Births  1            Home Medications    Prior to Admission medications   Medication Sig Start Date End Date Taking? Authorizing  Provider  albuterol (VENTOLIN HFA) 108 (90 Base) MCG/ACT inhaler Inhale 1-2 puffs into the lungs every 6 (six) hours as needed for wheezing or shortness of breath. 09/27/23  Yes Micheale Schlack E, PA-C  amoxicillin -clavulanate (AUGMENTIN) 875-125 MG tablet Take 1 tablet by mouth every 12 (twelve) hours. 09/27/23  Yes Sadey Yandell E, PA-C  azithromycin (ZITHROMAX) 250 MG tablet Take 2 tablets (500 mg total) by mouth daily for 1 day, THEN 1 tablet (250 mg total) daily for 4 days. 09/27/23 10/02/23 Yes Kailin Principato E, PA-C  busPIRone  (BUSPAR ) 5 MG tablet TAKE 1 TABLET BY MOUTH 2 TIMES A DAY 08/10/23   [place], Alonza Jansky, MD  cefpodoxime (VANTIN) 200 MG tablet Take 1 tablet (200 mg total) by mouth 2 (two) times daily for 7 days. 09/28/23 10/05/23  Afton Horse T, DO  estradiol  (ESTRACE ) 0.1 MG/GM vaginal cream Fingertip amount to affected area twice daily for 14 days then decrease to one application twice weekly 02/26/23   Lo, Donna K, CNM  levothyroxine  (SYNTHROID ) 50 MCG tablet TAKE 1 TABLET BY MOUTH DAILY 07/25/23   de [place], Alonza Jansky, MD  metoprolol  tartrate (LOPRESSOR ) 25 MG tablet Take 12.5 mg by mouth 2 (two) times daily as needed. 05/28/21  [provider]  scopolamine  (TRANSDERM-SCOP) 1 MG/3DAYS Place 1 patch (1.5 mg total) onto the skin every 3 (three) days. Apply patch at least 4 hours prior to getting on cruise. 09/26/23   Lillian Rein, MD  sertraline  (ZOLOFT ) 100 MG tablet Take 2 tablets (200 mg total) by mouth in the morning. 07/26/23   Lillian Rein, MD    Family History Family History  Problem Relation Age of Onset   Hypertension Father    Hyperlipidemia Father    Fibromyalgia Mother    Hyperlipidemia Maternal Grandmother    Hyperlipidemia Maternal Grandfather    Depression Maternal Grandfather    COPD Paternal Grandmother        pancreatic   Heart disease Paternal Grandfather    Cancer Neg Hx    Diabetes Neg Hx     Social History Social History   Tobacco Use   Smoking  status: Never    Passive exposure: Never   Smokeless tobacco: Never  Vaping Use   Vaping status: Never Used  Substance Use Topics   Alcohol use: Never   Drug use: Never     Allergies   Patient has no known allergies.   Review of Systems Review of Systems  Constitutional:  Positive for fatigue. Negative for chills and fever.  HENT:  Positive for congestion, rhinorrhea, sinus pressure, sinus pain and sore throat (mild and usually with coughing). Negative for ear pain.   Respiratory:  Positive for cough and shortness of breath. Negative for chest tightness.   Gastrointestinal:  Positive for nausea (she is [redacted] weeks pregnant). Negative for diarrhea and vomiting.  Musculoskeletal:  Negative for myalgias.     Physical Exam Triage Vital Signs ED Triage Vitals  Encounter Vitals Group     BP 09/27/23 0929 104/73     Systolic BP Percentile --      Diastolic BP Percentile --      Pulse Rate 09/27/23 0929 88     Resp 09/27/23 0929 20     Temp 09/27/23 0929 98.1 F (36.7 C)     Temp Source 09/27/23 0929 Oral     SpO2 09/27/23 0929 98 %     Weight 09/27/23 0933 195 lb (88.5 kg)     Height 09/27/23 0933 5\' 5"  (1.651 m)     Head Circumference --      Peak Flow --      Pain Score 09/27/23 0933 2     Pain Loc --      Pain Education --      Exclude from Growth Chart --    No data found.  Updated Vital Signs BP 104/73 (BP Location: Right Arm)   Pulse 88   Temp 98.1 F (36.7 C) (Oral)   Resp 20   Ht 5\' 5"  (1.651 m)   Wt 195 lb (88.5 kg)   LMP 07/03/2023   SpO2 98%   BMI 32.45 kg/m   Visual Acuity Right Eye Distance:   Left Eye Distance:   Bilateral Distance:    Right Eye Near:   Left Eye Near:    Bilateral Near:     Physical Exam Vitals reviewed.  Constitutional:      General: She is awake.     Appearance: Normal appearance. She is well-developed and well-groomed.  HENT:     Head: Normocephalic and atraumatic.     Right Ear: Hearing, tympanic membrane and ear  canal normal.     Left Ear: Hearing, tympanic membrane and  ear canal normal.     Mouth/Throat:     Lips: Pink.     Mouth: Mucous membranes are moist.     Pharynx: Oropharynx is clear. Uvula midline. No pharyngeal swelling, oropharyngeal exudate, posterior oropharyngeal erythema, uvula swelling or postnasal drip.  Cardiovascular:     Rate and Rhythm: Normal rate and regular rhythm.     Pulses: Normal pulses.          Radial pulses are 2+ on the right side and 2+ on the left side.     Heart sounds: Normal heart sounds. No murmur heard.    No friction rub. No gallop.  Pulmonary:     Effort: Tachypnea present. No respiratory distress.     Breath sounds: Decreased air movement present. Examination of the right-upper field reveals wheezing and rhonchi. Examination of the left-upper field reveals wheezing and rhonchi. Wheezing and rhonchi present. No decreased breath sounds or rales.     Comments: Patient appears to have mildly labored breathing and some tachypnea.  Upper bilateral lung fields do have wheezes and mild rhonchi as well Musculoskeletal:     Cervical back: Normal range of motion and neck supple.  Lymphadenopathy:     Head:     Right side of head: No submental, submandibular or preauricular adenopathy.     Left side of head: No submental, submandibular or preauricular adenopathy.     Cervical:     Right cervical: No superficial cervical adenopathy.    Left cervical: No superficial cervical adenopathy.     Upper Body:     Right upper body: No supraclavicular adenopathy.     Left upper body: No supraclavicular adenopathy.  Skin:    General: Skin is warm and dry.  Neurological:     General: No focal deficit present.     Mental Status: She is alert and oriented to person, place, and time.  Psychiatric:        Mood and Affect: Mood normal.        Behavior: Behavior normal. Behavior is cooperative.        Thought Content: Thought content normal.        Judgment: Judgment normal.       UC Treatments / Results  Labs (all labs ordered are listed, but only abnormal results are displayed) Labs Reviewed  POC COVID19/FLU A&B COMBO - Normal    EKG    Procedures Procedures (including critical care time)  Medications Ordered in UC Medications  ipratropium-albuterol (DUONEB) 0.5-2.5 (3) MG/3ML nebulizer solution 3 mL (3 mLs Nebulization Given 09/27/23 1027)    Initial Impression / Assessment and Plan / UC Course  I have reviewed the triage vital signs and the nursing notes.  Pertinent labs & imaging results that were available during my care of the patient were reviewed by me and considered in my medical decision making (see chart for details).      Final Clinical Impressions(s) / UC Diagnoses   Final diagnoses:  Upper respiratory tract infection, unspecified type  SOB (shortness of breath)  Wheezing   Patient presents today with concerns for persistent, productive cough, shortness of breath, nasal congestion and wheezing for approximately 1 week.  She does endorse recent travel on a cruise and states that several of her family members have had similar symptoms.  She reports that she has taken Claritin yesterday with no improvement in symptoms.  Of note patient is approximately [redacted] weeks pregnant and was not sure what to take over-the-counter for her  symptoms.  Physical exam is concerning for wheezing as well as decreased air movement.  A DuoNeb treatment was administered in the clinic which seemed to provide some relief of the symptoms per patient report and it wheezing and air movement also improved on reexam.  Discussed with patient that since she is pregnant I am hesitant to complete a chest x-ray but I do think it would be appropriate to go ahead and treat her for community-acquired pneumonia.  Will start Augmentin p.o. twice daily x 7 days as well as a Z-Pak.  Patient states that she would also be comfortable receiving an albuterol inhaler to use at home for  continued shortness of breath.  Recommend regular formulations of Mucinex, Robitussin, Tylenol  as needed for further symptomatic relief.  ED and return precautions were reviewed and provided in after visit summary.  Recommend close follow-up as needed for persistent or progressing symptoms.    Discharge Instructions      You were seen today for concerns of productive coughing, fatigue, nasal congestion and ear fullness as well as shortness of breath.  At this time I am suspicious that you may have an upper respiratory tract infection but I cannot rule out potential pneumonia especially given your wheezing and breathing concerns.  We have administered a DuoNeb treatment here in the clinic to assist with your breathing which seem to help improve your airflow.  I have also sent in an albuterol inhaler for you to use up to every 6 hours as needed for shortness of breath and coughing.  This may cause some rapid heart beating and shakiness but this is a normal reaction to this medication. Given the concern for potential pneumonia I am sending you in a medication regimen comprised of 2 antibiotics.  1 is a medication called Augmentin to be taken twice per day for 7 days.  The other is a medication called azithromycin also known as a Z-Pak.  The Z-Pak should help with pulmonary inflammation and help you breathe a little bit easier in addition to treating a bacterial infection. Please continue to take over-the-counter medications as recommended by your OB/GYN for symptom management.  It is okay for you to take Tylenol  as needed for pain and fever.  You can also take regular formulations of Robitussin, Mucinex and Flonase as needed for symptom management.  If at any point you feel like you are having fevers or chills that are not responding to Tylenol , difficulty breathing, coughing, chest tightness or pain, confusion, passing out please go to the emergency room as these could be signs of medical emergency.   ED  Prescriptions     Medication Sig Dispense Auth. Provider   amoxicillin -clavulanate (AUGMENTIN) 875-125 MG tablet Take 1 tablet by mouth every 12 (twelve) hours. 14 tablet Oda Placke E, PA-C   azithromycin (ZITHROMAX) 250 MG tablet Take 2 tablets (500 mg total) by mouth daily for 1 day, THEN 1 tablet (250 mg total) daily for 4 days. 6 each Adalyna Godbee E, PA-C   albuterol (VENTOLIN HFA) 108 (90 Base) MCG/ACT inhaler Inhale 1-2 puffs into the lungs every 6 (six) hours as needed for wheezing or shortness of breath. 8 g Ayven Glasco E, PA-C      PDMP not reviewed this encounter.   Woodford Hayward 09/28/23 2041

## 2023-09-28 ENCOUNTER — Other Ambulatory Visit: Payer: Self-pay

## 2023-09-28 ENCOUNTER — Encounter (HOSPITAL_BASED_OUTPATIENT_CLINIC_OR_DEPARTMENT_OTHER): Payer: Self-pay | Admitting: *Deleted

## 2023-09-28 ENCOUNTER — Emergency Department (HOSPITAL_BASED_OUTPATIENT_CLINIC_OR_DEPARTMENT_OTHER)
Admission: EM | Admit: 2023-09-28 | Discharge: 2023-09-28 | Disposition: A | Discharge status: Home / Self Care | Attending: Emergency Medicine | Admitting: Emergency Medicine

## 2023-09-28 ENCOUNTER — Emergency Department (HOSPITAL_BASED_OUTPATIENT_CLINIC_OR_DEPARTMENT_OTHER)

## 2023-09-28 ENCOUNTER — Ambulatory Visit: Payer: Self-pay

## 2023-09-28 DIAGNOSIS — J3489 Other specified disorders of nose and nasal sinuses: Secondary | ICD-10-CM | POA: Diagnosis not present

## 2023-09-28 DIAGNOSIS — Z79899 Other long term (current) drug therapy: Secondary | ICD-10-CM | POA: Diagnosis not present

## 2023-09-28 DIAGNOSIS — Z3A08 8 weeks gestation of pregnancy: Secondary | ICD-10-CM | POA: Insufficient documentation

## 2023-09-28 DIAGNOSIS — J189 Pneumonia, unspecified organism: Secondary | ICD-10-CM

## 2023-09-28 DIAGNOSIS — R059 Cough, unspecified: Secondary | ICD-10-CM | POA: Insufficient documentation

## 2023-09-28 DIAGNOSIS — E039 Hypothyroidism, unspecified: Secondary | ICD-10-CM | POA: Insufficient documentation

## 2023-09-28 DIAGNOSIS — O99511 Diseases of the respiratory system complicating pregnancy, first trimester: Secondary | ICD-10-CM | POA: Insufficient documentation

## 2023-09-28 DIAGNOSIS — O26891 Other specified pregnancy related conditions, first trimester: Secondary | ICD-10-CM | POA: Diagnosis not present

## 2023-09-28 LAB — CBC WITH DIFFERENTIAL/PLATELET
Abs Immature Granulocytes: 0.03 10*3/uL (ref 0.00–0.07)
Basophils Absolute: 0 10*3/uL (ref 0.0–0.1)
Basophils Relative: 1 %
Eosinophils Absolute: 0.2 10*3/uL (ref 0.0–0.5)
Eosinophils Relative: 4 %
HCT: 37.2 % (ref 36.0–46.0)
Hemoglobin: 12.3 g/dL (ref 12.0–15.0)
Immature Granulocytes: 1 %
Lymphocytes Relative: 30 %
Lymphs Abs: 1.9 10*3/uL (ref 0.7–4.0)
MCH: 28 pg (ref 26.0–34.0)
MCHC: 33.1 g/dL (ref 30.0–36.0)
MCV: 84.5 fL (ref 80.0–100.0)
Monocytes Absolute: 0.5 10*3/uL (ref 0.1–1.0)
Monocytes Relative: 8 %
Neutro Abs: 3.6 10*3/uL (ref 1.7–7.7)
Neutrophils Relative %: 56 %
Platelets: 279 10*3/uL (ref 150–400)
RBC: 4.4 MIL/uL (ref 3.87–5.11)
RDW: 14 % (ref 11.5–15.5)
WBC: 6.3 10*3/uL (ref 4.0–10.5)
nRBC: 0 % (ref 0.0–0.2)

## 2023-09-28 LAB — URINALYSIS, W/ REFLEX TO CULTURE (INFECTION SUSPECTED)
Bilirubin Urine: NEGATIVE
Glucose, UA: NEGATIVE mg/dL
Hgb urine dipstick: NEGATIVE
Ketones, ur: NEGATIVE mg/dL
Leukocytes,Ua: NEGATIVE
Nitrite: NEGATIVE
Protein, ur: NEGATIVE mg/dL
Specific Gravity, Urine: 1.025 (ref 1.005–1.030)
WBC, UA: NONE SEEN WBC/hpf (ref 0–5)
pH: 7 (ref 5.0–8.0)

## 2023-09-28 LAB — COMPREHENSIVE METABOLIC PANEL WITH GFR
ALT: 13 U/L (ref 0–44)
AST: 18 U/L (ref 15–41)
Albumin: 4.5 g/dL (ref 3.5–5.0)
Alkaline Phosphatase: 59 U/L (ref 38–126)
Anion gap: 15 (ref 5–15)
BUN: 12 mg/dL (ref 6–20)
CO2: 19 mmol/L — ABNORMAL LOW (ref 22–32)
Calcium: 9.6 mg/dL (ref 8.9–10.3)
Chloride: 101 mmol/L (ref 98–111)
Creatinine, Ser: 0.74 mg/dL (ref 0.44–1.00)
GFR, Estimated: >60 mL/min (ref >60)
Glucose, Bld: 82 mg/dL (ref 70–99)
Potassium: 3.9 mmol/L (ref 3.5–5.1)
Sodium: 136 mmol/L (ref 135–145)
Total Bilirubin: 0.5 mg/dL (ref 0.0–1.2)
Total Protein: 7.9 g/dL (ref 6.5–8.1)

## 2023-09-28 LAB — RESP PANEL BY RT-PCR (RSV, FLU A&B, COVID)  RVPGX2
Influenza A by PCR: NEGATIVE
Influenza B by PCR: NEGATIVE
Resp Syncytial Virus by PCR: NEGATIVE
SARS Coronavirus 2 by RT PCR: NEGATIVE

## 2023-09-28 LAB — LACTIC ACID, PLASMA
Lactic Acid, Venous: 1.1 mmol/L (ref 0.5–1.9)
Lactic Acid, Venous: 1.6 mmol/L (ref 0.5–1.9)

## 2023-09-28 MED ORDER — SODIUM CHLORIDE 0.9 % IV SOLN
INTRAVENOUS | Status: DC | PRN
Start: 2023-09-28 — End: 2023-09-29

## 2023-09-28 MED ORDER — CEFPODOXIME PROXETIL 200 MG PO TABS: 200.0000 mg | ORAL_TABLET | Freq: Two times a day (BID) | ORAL | 0 refills | Status: AC

## 2023-09-28 MED ORDER — SODIUM CHLORIDE 0.9 % IV SOLN
1.0000 g | Freq: Once | INTRAVENOUS | Status: AC
  Administered 2023-09-28: 1 g via INTRAVENOUS
  Filled 2023-09-28: qty 10

## 2023-09-28 MED ORDER — IPRATROPIUM-ALBUTEROL 0.5-2.5 (3) MG/3ML IN SOLN
3.0000 mL | Freq: Once | RESPIRATORY_TRACT | Status: AC
  Administered 2023-09-28: 3 mL via RESPIRATORY_TRACT
  Filled 2023-09-28: qty 3

## 2023-09-28 NOTE — ED Provider Notes (Signed)
 [place] [facility] Provider Note  CSN: [id number] Arrival date & time: 09/28/23 1618  Chief Complaint(s) Shortness of Breath  HPI Carol [facility] is a 28 y.o. female who is here today for shortness of breath.  Patient has been coughing for the last 1 week, but over the last couple of days it is got worse.  She went to an urgent care yesterday was prescribed azithromycin and Augmentin, but states that her symptoms appear to have worsened.  She is [redacted] weeks pregnant.   Past Medical History Past Medical History:  Diagnosis Date   ADHD    Anxiety    Insomnia 11/22/2020   Palpitations 11/22/2020   Status post vacuum-assisted vaginal delivery 04/08/2021   Delivery 04/08/21   Patient Active Problem List   Diagnosis Date Noted   Wellness examination 01/18/2023   Subcutaneous nodule 01/01/2023   Hypothyroidism 10/05/2022   Hyperlipidemia 10/05/2022   Mixed anxiety and depressive disorder 07/06/2021   Iron deficiency anemia 07/06/2021   Vitamin D  deficiency 07/06/2021   Atypical squamous cell changes of undetermined significance (ASCUS) on cervical cytology with negative high risk human papilloma virus (HPV) test result 05/13/2021   Palpitations 11/22/2020   Anxiety 08/26/2019   Attention deficit disorder 01/27/2016   Home Medication(s) Prior to Admission medications   Medication Sig Start Date End Date Taking? Authorizing Provider  cefpodoxime (VANTIN) 200 MG tablet Take 1 tablet (200 mg total) by mouth 2 (two) times daily for 7 days. 09/28/23 10/05/23 Yes Afton Horse T, DO  albuterol (VENTOLIN HFA) 108 (90 Base) MCG/ACT inhaler Inhale 1-2 puffs into the lungs every 6 (six) hours as needed for wheezing or shortness of breath. 09/27/23   Mecum, Erin E, PA-C  amoxicillin -clavulanate (AUGMENTIN) 875-125 MG tablet Take 1 tablet by mouth every 12 (twelve) hours. 09/27/23   Mecum, Erin E, PA-C  azithromycin (ZITHROMAX) 250 MG tablet Take 2 tablets (500 mg  total) by mouth daily for 1 day, THEN 1 tablet (250 mg total) daily for 4 days. 09/27/23 10/02/23  Mecum, Erin E, PA-C  busPIRone  (BUSPAR ) 5 MG tablet TAKE 1 TABLET BY MOUTH 2 TIMES A DAY 08/10/23   [place], Alonza Jansky, MD  estradiol  (ESTRACE ) 0.1 MG/GM vaginal cream Fingertip amount to affected area twice daily for 14 days then decrease to one application twice weekly 02/26/23   Lo, Donna K, CNM  levothyroxine  (SYNTHROID ) 50 MCG tablet TAKE 1 TABLET BY MOUTH DAILY 07/25/23   [place], Alonza Jansky, MD  metoprolol  tartrate (LOPRESSOR ) 25 MG tablet Take 12.5 mg by mouth 2 (two) times daily as needed. 05/28/21   [provider]  scopolamine  (TRANSDERM-SCOP) 1 MG/3DAYS Place 1 patch (1.5 mg total) onto the skin every 3 (three) days. Apply patch at least 4 hours prior to getting on cruise. 09/26/23   Lillian Rein, MD  sertraline  (ZOLOFT ) 100 MG tablet Take 2 tablets (200 mg total) by mouth in the morning. 07/26/23   Lillian Rein, MD  Past Surgical History History reviewed. No pertinent surgical history. Family History Family History  Problem Relation Age of Onset   Hypertension Father    Hyperlipidemia Father    Fibromyalgia Mother    Hyperlipidemia Maternal Grandmother    Hyperlipidemia Maternal Grandfather    Depression Maternal Grandfather    COPD Paternal Grandmother        pancreatic   Heart disease Paternal Grandfather    Cancer Neg Hx    Diabetes Neg Hx     Social History Social History   Tobacco Use   Smoking status: Never    Passive exposure: Never   Smokeless tobacco: Never  Vaping Use   Vaping status: Never Used  Substance Use Topics   Alcohol use: Never   Drug use: Never   Allergies Patient has no known allergies.  Review of Systems Review of Systems  Physical Exam Vital Signs  I have reviewed the triage vital signs BP 102/63 (BP  Location: Left Arm)   Pulse 100   Temp 99.2 F (37.3 C) (Oral)   Resp (!) 22   Ht 5\' 5"  (1.651 m)   Wt 88.5 kg   LMP 07/03/2023   SpO2 100% Comment: Simultaneous filing. User may not have seen previous data.  BMI 32.45 kg/m   Physical Exam Vitals and nursing note reviewed.  Constitutional:      Appearance: She is not toxic-appearing.  Pulmonary:     Breath sounds: Wheezing and rhonchi present. No decreased breath sounds.  Abdominal:     Palpations: Abdomen is soft.  Musculoskeletal:        General: Normal range of motion.  Neurological:     General: No focal deficit present.     Mental Status: She is alert.     ED Results and Treatments Labs (all labs ordered are listed, but only abnormal results are displayed) Labs Reviewed  COMPREHENSIVE METABOLIC PANEL WITH GFR - Abnormal; Notable for the following components:      Result Value   CO2 19 (*)    All other components within normal limits  URINALYSIS, W/ REFLEX TO CULTURE (INFECTION SUSPECTED) - Abnormal; Notable for the following components:   Bacteria, UA FEW (*)    All other components within normal limits  RESP PANEL BY RT-PCR (RSV, FLU A&B, COVID)  RVPGX2  LACTIC ACID, PLASMA  LACTIC ACID, PLASMA  CBC WITH DIFFERENTIAL/PLATELET                                                                                                                          Radiology DG Chest 2 View Result Date: 09/28/2023 CLINICAL DATA:  Shortness of breath cough EXAM: CHEST - 2 VIEW COMPARISON:  None Available. FINDINGS: Suspicion of small patchy infiltrates in the right mid lung and left base. Normal cardiac size. No pleural effusion. No pneumothorax IMPRESSION: Suspicion of small patchy infiltrates in the right mid lung and left base. Electronically Signed   By: Ulyess Gammons.D.  On: 09/28/2023 18:58    Pertinent labs & imaging results that were available during my care of the patient were reviewed by me and considered in my medical  decision making (see MDM for details).  Medications Ordered in ED Medications  0.9 %  sodium chloride  infusion (0 mLs Intravenous Stopped 09/28/23 1836)  cefTRIAXone (ROCEPHIN) 1 g in sodium chloride  0.9 % 100 mL IVPB (0 g Intravenous Stopped 09/28/23 1836)  ipratropium-albuterol (DUONEB) 0.5-2.5 (3) MG/3ML nebulizer solution 3 mL (3 mLs Nebulization Given by Other 09/28/23 1753)                                                                                                                                     Procedures Procedures  (including critical care time)  Medical Decision Making / ED Course   This patient presents to the ED for concern of shortness of breath, this involves an extensive number of treatment options, and is a complaint that carries with it a high risk of complications and morbidity.  The differential diagnosis includes pneumonia, viral syndrome.  Less likely PE.  MDM: On exam, patient with coarse rhonchorous breath sounds bilaterally, left worse than right.  High suspicion for pneumonia.  Provided patient with some albuterol to see if this would help symptomatically.  Gave her a dose of Rocephin.  Reevaluated the patient following the above, and she did feel quite a bit better.  Will discharge patient with a prescription for cefpodoxime, have her continue taking azithromycin.  Strict return precautions discussed with patient at bedside.   Additional history obtained: -Additional history obtained from  -External records from outside source obtained and reviewed including: Chart review including previous notes, labs, imaging, consultation notes   Lab Tests: -I ordered, reviewed, and interpreted labs.   The pertinent results include:   Labs Reviewed  COMPREHENSIVE METABOLIC PANEL WITH GFR - Abnormal; Notable for the following components:      Result Value   CO2 19 (*)    All other components within normal limits  URINALYSIS, W/ REFLEX TO CULTURE (INFECTION  SUSPECTED) - Abnormal; Notable for the following components:   Bacteria, UA FEW (*)    All other components within normal limits  RESP PANEL BY RT-PCR (RSV, FLU A&B, COVID)  [id number]  LACTIC ACID, PLASMA  LACTIC ACID, PLASMA  CBC WITH DIFFERENTIAL/PLATELET       Imaging Studies ordered: I ordered imaging studies including chest x-ray I independently visualized and interpreted imaging. I agree with the radiologist interpretation   Medicines ordered and prescription drug management: Meds ordered this encounter  Medications   cefTRIAXone (ROCEPHIN) 1 g in sodium chloride  0.9 % 100 mL IVPB    Antibiotic Indication::   CAP   ipratropium-albuterol (DUONEB) 0.5-2.5 (3) MG/3ML nebulizer solution 3 mL   0.9 %  sodium chloride  infusion    Carrier Fluid Protocol   cefpodoxime (VANTIN) 200 MG tablet  Sig: Take 1 tablet (200 mg total) by mouth 2 (two) times daily for 7 days.    Dispense:  14 tablet    Refill:  0    -I have reviewed the patients home medicines and have made adjustments as needed    Cardiac Monitoring: The patient was maintained on a cardiac monitor.  I personally viewed and interpreted the cardiac monitored which showed an underlying rhythm of: Normal sinus rhythm  Social Determinants of Health:  Factors impacting patients care include: Lack of access to primary care   Reevaluation: After the interventions noted above, I reevaluated the patient and found that they have :improved  Co morbidities that complicate the patient evaluation  Past Medical History:  Diagnosis Date   ADHD    Anxiety    Insomnia 11/22/2020   Palpitations 11/22/2020   Status post vacuum-assisted vaginal delivery 04/08/2021   Delivery 04/08/21      Dispostion: I considered admission for this patient, however she is appropriate for outpatient follow-up.     Final Clinical Impression(s) / ED Diagnoses Final diagnoses:  Pneumonia of both lungs due to infectious organism, unspecified  part of lung     @PCDICTATION @    Afton Horse T, DO 09/28/23 1951

## 2023-09-28 NOTE — ED Triage Notes (Signed)
 Per UC note from yesterday:  Pt presents with complaints of cough, "rattling in chest", and shortness of breath x 1 week however has become worse over the last two days. Denies fevers at home. Pt states she did just return from a cruise on Sunday 5/18. Pt currently rates her overall pain a 2/10. Pt is [redacted] weeks pregnant. Claritin taken yesterday with no improvement.   Pt confirms same. Not better after starting antibiotic yesterday. Fatigue has increased. Congested cough present. Denies recent xray. Rates current pain 3/10 with cough. OBGYN [place] [address]. Last visit Tuesday. Endorses not high risk. G2P1.

## 2023-09-28 NOTE — Telephone Encounter (Signed)
   Chief Complaint: shortness of breath Symptoms: pheumonia dx 5/22 Frequency: x4 days Pertinent Negatives: Patient denies chest pain Disposition: [x] ED /[] Urgent Care (no appt availability in office) / [] Appointment(In office/virtual)/ []  [facility]/ [] Home Care/ [] Refused Recommended Disposition /[] [facility]/ []  Follow-up with PCP Additional Notes: [redacted] week pregnant female with recent travel to [place] and [place] on cruise with moderate SOB. Seen yesterday at UC, prescribed ABX. No change in symptoms today.  Advised care at ED  Copied from CRM [id number]. Topic: Clinical - Red Word Triage >> Sep 28, 2023  3:25 PM Antwanette L wrote: Red Word that prompted transfer to Nurse Triage: Patient was seen at urgent care on 5/22 and was diagnosed with pneumonia. Patient is having sob and fatigue Reason for Disposition  [1] MODERATE difficulty breathing (e.g., speaks in phrases, SOB even at rest, pulse 100-120) AND [2] NEW-onset or WORSE than normal  Answer Assessment - Initial Assessment Questions 1. RESPIRATORY STATUS: "Describe your breathing?" (e.g., wheezing, shortness of breath, unable to speak, severe coughing)      Shortness of breath 2. ONSET: "When did this breathing problem begin?"      X one week, much worse over last three to four days 3. PATTERN "Does the difficult breathing come and go, or has it been constant since it started?"      constant 4. SEVERITY: "How bad is your breathing?" (e.g., mild, moderate, severe)    - MILD: No SOB at rest, mild SOB with walking, speaks normally in sentences, can lie down, no retractions, pulse < 100.    - MODERATE: SOB at rest, SOB with minimal exertion and prefers to sit, cannot lie down flat, speaks in phrases, mild retractions, audible wheezing, pulse 100-120.    - SEVERE: Very SOB at rest, speaks in single words, struggling to breathe, sitting hunched forward, retractions, pulse > 120      moderate 5. RECURRENT SYMPTOM:  "Have you had difficulty breathing before?" If Yes, ask: "When was the last time?" and "What happened that time?"      no 6. CARDIAC HISTORY: "Do you have any history of heart disease?" (e.g., heart attack, angina, bypass surgery, angioplasty)      no 7. LUNG HISTORY: "Do you have any history of lung disease?"  (e.g., pulmonary embolus, asthma, emphysema)     no 8. CAUSE: "What do you think is causing the breathing problem?"      Pneumonia per PA at UC one day ago 9. OTHER SYMPTOMS: "Do you have any other symptoms? (e.g., dizziness, runny nose, cough, chest pain, fever)     fatigued 10. O2 SATURATION MONITOR:  "Do you use an oxygen saturation monitor (pulse oximeter) at home?" If Yes, ask: "What is your reading (oxygen level) today?" "What is your usual oxygen saturation reading?" (e.g., 95%)       no 11. PREGNANCY: "Is there any chance you are pregnant?" "When was your last menstrual period?"       yes 12. TRAVEL: "Have you traveled out of the country in the last month?" (e.g., travel history, exposures)       Recently returned from cruise to [place] and [place]  Protocols used: Breathing Difficulty-A-AH

## 2023-09-28 NOTE — Discharge Instructions (Addendum)
 You were diagnosed with pneumonia.  I have sent your prescription for a medication called cefpodoxime.  You can take this once in the morning and once in the evening for the next 1 week.  Stop taking Augmentin.  Continue taking azithromycin.  Return to the emergency room if you develop increasing shortness of breath, confusion, worsening fever.  Symptoms should begin to improve over the next 48 hours.

## 2023-09-28 NOTE — ED Notes (Signed)
 Patient transported to X-ray

## 2023-09-29 ENCOUNTER — Encounter (HOSPITAL_BASED_OUTPATIENT_CLINIC_OR_DEPARTMENT_OTHER): Payer: Self-pay | Admitting: Obstetrics & Gynecology

## 2023-09-29 NOTE — Progress Notes (Signed)
 GYNECOLOGY  VISIT  CC:   Discuss ultrasound results, dating and viability scan  HPI: 28 y.o. G83P1001 Married White or Caucasian female here for discussion of ultrasound results.  Stopped birth control in January.  LMP unsure due to abnormal/prolonged bleeding in February.  Carol Clark IUD noted measuring 8 w 0/7 days.  Cervix long and closed.  She has started having nausea and noted this was much worse with removal of scopolamine  patch.  Discussed this can be used for nausea in pregnancy and ok to continue if needed.  Would like updated prescription for this.    Medications reviewed.  On leovhtyroxin, buspar , sertraline .     Past Medical History:  Diagnosis Date   ADHD    Anxiety    Insomnia 11/22/2020   Palpitations 11/22/2020   Status post vacuum-assisted vaginal delivery 04/08/2021   Delivery 04/08/21    MEDS:   Current Outpatient Medications on File Prior to Visit  Medication Sig Dispense Refill   busPIRone  (BUSPAR ) 5 MG tablet TAKE 1 TABLET BY MOUTH 2 TIMES A DAY 90 tablet 1   estradiol  (ESTRACE ) 0.1 MG/GM vaginal cream Fingertip amount to affected area twice daily for 14 days then decrease to one application twice weekly 42.5 g 2   levothyroxine  (SYNTHROID ) 50 MCG tablet TAKE 1 TABLET BY MOUTH DAILY 90 tablet 1   metoprolol  tartrate (LOPRESSOR ) 25 MG tablet Take 12.5 mg by mouth 2 (two) times daily as needed.     sertraline  (ZOLOFT ) 100 MG tablet Take 2 tablets (200 mg total) by mouth in the morning. 180 tablet 3   No current facility-administered medications on file prior to visit.    ALLERGIES: Patient has no known allergies.  SH:  married, non smoker  Review of Systems  Constitutional: Negative.   Cardiovascular: Negative.   Gastrointestinal:  Positive for nausea.  Genitourinary: Negative.     PHYSICAL EXAMINATION:    LMP 07/03/2023      Physical Exam Constitutional:      Appearance: Normal appearance.  Neurological:     General: No focal deficit present.   Psychiatric:        Mood and Affect: Mood normal.     Assessment/Plan: 1. [redacted] weeks gestation of pregnancy (Primary) - on PNV - NOB appts will be scheduled  2. Nausea and vomiting during pregnancy - scopolamine  (TRANSDERM-SCOP) 1 MG/3DAYS; Place 1 patch (1.5 mg total) onto the skin every 3 (three) days. Apply patch at least 4 hours prior to getting on cruise.  Dispense: 10 patch; Refill: 2

## 2023-10-12 ENCOUNTER — Encounter (HOSPITAL_BASED_OUTPATIENT_CLINIC_OR_DEPARTMENT_OTHER): Admitting: Certified Nurse Midwife

## 2023-10-16 ENCOUNTER — Other Ambulatory Visit (HOSPITAL_BASED_OUTPATIENT_CLINIC_OR_DEPARTMENT_OTHER): Payer: Self-pay | Admitting: Certified Nurse Midwife

## 2023-10-16 ENCOUNTER — Encounter (HOSPITAL_BASED_OUTPATIENT_CLINIC_OR_DEPARTMENT_OTHER): Payer: Self-pay | Admitting: Certified Nurse Midwife

## 2023-10-16 ENCOUNTER — Other Ambulatory Visit (HOSPITAL_COMMUNITY)
Admission: RE | Admit: 2023-10-16 | Discharge: 2023-10-16 | Disposition: A | Discharge status: Home / Self Care | Source: Ambulatory Visit | Attending: Certified Nurse Midwife | Admitting: Certified Nurse Midwife

## 2023-10-16 ENCOUNTER — Ambulatory Visit (HOSPITAL_BASED_OUTPATIENT_CLINIC_OR_DEPARTMENT_OTHER): Admitting: Certified Nurse Midwife

## 2023-10-16 VITALS — BP 91/60 | HR 81 | Wt 204.8 lb

## 2023-10-16 DIAGNOSIS — E079 Disorder of thyroid, unspecified: Secondary | ICD-10-CM | POA: Insufficient documentation

## 2023-10-16 DIAGNOSIS — Z3A10 10 weeks gestation of pregnancy: Secondary | ICD-10-CM | POA: Diagnosis not present

## 2023-10-16 DIAGNOSIS — O99210 Obesity complicating pregnancy, unspecified trimester: Secondary | ICD-10-CM | POA: Insufficient documentation

## 2023-10-16 DIAGNOSIS — Z8679 Personal history of other diseases of the circulatory system: Secondary | ICD-10-CM | POA: Diagnosis not present

## 2023-10-16 DIAGNOSIS — O99281 Endocrine, nutritional and metabolic diseases complicating pregnancy, first trimester: Secondary | ICD-10-CM | POA: Diagnosis not present

## 2023-10-16 DIAGNOSIS — O99280 Endocrine, nutritional and metabolic diseases complicating pregnancy, unspecified trimester: Secondary | ICD-10-CM | POA: Insufficient documentation

## 2023-10-16 DIAGNOSIS — O99211 Obesity complicating pregnancy, first trimester: Secondary | ICD-10-CM

## 2023-10-16 DIAGNOSIS — O2690 Pregnancy related conditions, unspecified, unspecified trimester: Secondary | ICD-10-CM

## 2023-10-16 DIAGNOSIS — O219 Vomiting of pregnancy, unspecified: Secondary | ICD-10-CM

## 2023-10-16 DIAGNOSIS — Z3480 Encounter for supervision of other normal pregnancy, unspecified trimester: Secondary | ICD-10-CM | POA: Insufficient documentation

## 2023-10-16 DIAGNOSIS — O2691 Pregnancy related conditions, unspecified, first trimester: Secondary | ICD-10-CM

## 2023-10-16 MED ORDER — NITROFURANTOIN MONOHYD MACRO 100 MG PO CAPS
100.0000 mg | ORAL_CAPSULE | Freq: Two times a day (BID) | ORAL | 0 refills | Status: DC
Start: 2023-10-16 — End: 2023-11-01

## 2023-10-16 MED ORDER — SCOPOLAMINE 1 MG/3DAYS TD PT72: 1.0000 | MEDICATED_PATCH | TRANSDERMAL | 2 refills | Status: DC

## 2023-10-16 MED ORDER — LEVOTHYROXINE SODIUM 50 MCG PO TABS: 50.0000 ug | ORAL_TABLET | Freq: Every day | ORAL | 1 refills | Status: DC

## 2023-10-16 NOTE — Progress Notes (Unsigned)
 New OB Intake   I explained I am completing New OB Intake today. We discussed EDD of 05/07/2024, by Ultrasound. Pt is G2P1001. I reviewed her allergies, medications and Medical/Surgical/OB history.    Patient Active Problem List   Diagnosis Date Noted   Supervision of other normal pregnancy, antepartum 10/16/2023   Subcutaneous nodule 01/01/2023   Hypothyroidism 10/05/2022   Hyperlipidemia 10/05/2022   Mixed anxiety and depressive disorder 07/06/2021   Vitamin D  deficiency 07/06/2021   Palpitations 11/22/2020   Attention deficit disorder 01/27/2016     Concerns addressed today  Delivery Plans Plans to deliver at [facility]. Discussed the nature of our practice with multiple providers including residents and students. Due to the size of the practice, the delivering provider will not be the same as those providing prenatal care.   Patient is not interested in water birth. Desires epidural.  MyChart/Babyscripts MyChart access verified. I explained pt will have some visits in office and some virtually. Babyscripts instructions given and order placed. Patient verifies receipt of registration text/e-mail. Account successfully created and app downloaded. If patient is a candidate for Optimized scheduling, add to sticky note.   Blood Pressure Cuff/Weight Scale Patient has private insurance; instructed to purchase blood pressure cuff and bring to first prenatal appt. Explained after first prenatal appt pt will check weekly and document in Babyscripts. Patient does have weight scale.  Anatomy US  Explained first scheduled US  will be around 19 weeks.   Is patient a CenteringPregnancy candidate?  Declined Declined due to Schedule    Is patient a Mom+Baby Combined Care candidate?  Declined   If accepted, confirm patient does not intend to move from the area for at least 12 months, then notify Mom+Baby staff  Is patient a candidate for Babyscripts Optimization? Yes, patient accepted     First visit review I reviewed new OB appt with patient. Explained pt will be seen by Provider at first visit. Discussed [facility] [place] genetic screening with patient. Desires Panorama w/ gender.  Prior Horizon Neg 4/4. Pt   Last Pap Diagnosis  Date Value Ref Range Status  07/26/2023   Final   - Negative for intraepithelial lesion or malignancy (NILM)   Start ASA 81mg  po once daily at [redacted]w[redacted]d. Continue prenatal vitamin. Transabd US  confirms single viable IUP (active) with FHR 155bpm.   1. Supervision of other normal pregnancy, antepartum (Primary) -Taking PNV - Start ASA 81mg  po once daily - PANORAMA PRENATAL TEST - Culture, OB Urine - Cervicovaginal ancillary only( [place]) - HIV (Save tube for possible reflex) - Hepatitis C antibody - ABO/Rh - Antibody screen - CBC - Hepatitis B surface antigen - HIV Antibody (routine testing w rflx) - HIV (Save tube for possible reflex) - RPR - Rubella screen - Hepatitis C antibody - US  MFM OB DETAIL +14 WK; Future  2. Thyroid  disease affecting pregnancy - On Synthroid  50mcg daily at start of pregnancy - Cervicovaginal ancillary only( ) - Thyroid  Panel With TSH - ABO/Rh - Antibody screen - CBC - Hepatitis B surface antigen - HIV Antibody (routine testing w rflx) - HIV (Save tube for possible reflex) - RPR - Rubella screen - Hepatitis C antibody  3. Obesity during pregnancy - PreGravid BMI 32 - A1C pending. No hx GDM  4. [redacted] weeks gestation of pregnancy   5. History of palpitations in adulthood (Metoprolol  prn) - Takes Metoprolol  prn - MFM Consult/antenatal testing  6. Complication of pregnancy, antepartum (Hx Palpitations, on Metoprolol  prn)   7. Nausea  and vomiting during pregnancy - scopolamine  (TRANSDERM-SCOP) 1 MG/3DAYS; Place 1 patch (1.5 mg total) onto the skin every 3 (three) days. Apply patch at least 4 hours prior to getting on cruise.  Dispense: 10 patch; Refill: 2   8. Hx Anxiety and Depression - Pt  doing well on Zoloft  and Buspar  at this time (10/16/23).   Yolanda Hence, CNM 10/16/2023  1:06 PM

## 2023-10-17 ENCOUNTER — Ambulatory Visit (HOSPITAL_BASED_OUTPATIENT_CLINIC_OR_DEPARTMENT_OTHER): Payer: Self-pay | Admitting: Certified Nurse Midwife

## 2023-10-17 DIAGNOSIS — Z3480 Encounter for supervision of other normal pregnancy, unspecified trimester: Secondary | ICD-10-CM

## 2023-10-17 LAB — HEMOGLOBIN A1C
Est. average glucose Bld gHb Est-mCnc: 105 mg/dL
Hgb A1c MFr Bld: 5.3 % (ref 4.8–5.6)

## 2023-10-17 LAB — CBC
Hematocrit: 35.9 % (ref 34.0–46.6)
Hemoglobin: 11.8 g/dL (ref 11.1–15.9)
MCH: 29.1 pg (ref 26.6–33.0)
MCHC: 32.9 g/dL (ref 31.5–35.7)
MCV: 89 fL (ref 79–97)
Platelets: 289 10*3/uL (ref 150–450)
RBC: 4.05 x10E6/uL (ref 3.77–5.28)
RDW: 14.1 % (ref 11.7–15.4)
WBC: 8.4 10*3/uL (ref 3.4–10.8)

## 2023-10-17 LAB — HEPATITIS C ANTIBODY: Hep C Virus Ab: NONREACTIVE

## 2023-10-17 LAB — THYROID PANEL WITH TSH
Free Thyroxine Index: 1.5 (ref 1.2–4.9)
T3 Uptake Ratio: 18 % — ABNORMAL LOW (ref 24–39)
T4, Total: 8.5 ug/dL (ref 4.5–12.0)
TSH: 3.75 u[IU]/mL (ref 0.450–4.500)

## 2023-10-17 LAB — CERVICOVAGINAL ANCILLARY ONLY
Chlamydia: NEGATIVE
Comment: NEGATIVE
Comment: NORMAL
Neisseria Gonorrhea: NEGATIVE

## 2023-10-17 LAB — HIV ANTIBODY (ROUTINE TESTING W REFLEX): HIV Screen 4th Generation wRfx: NONREACTIVE

## 2023-10-17 LAB — ABO/RH: Rh Factor: POSITIVE

## 2023-10-17 LAB — RUBELLA SCREEN: Rubella Antibodies, IGG: 1.56 {index} (ref >0.99)

## 2023-10-17 LAB — ANTIBODY SCREEN: Antibody Screen: NEGATIVE

## 2023-10-17 LAB — RPR: RPR Ser Ql: NONREACTIVE

## 2023-10-17 LAB — HEPATITIS B SURFACE ANTIGEN: Hepatitis B Surface Ag: NEGATIVE

## 2023-10-18 LAB — CULTURE, OB URINE

## 2023-10-18 LAB — URINE CULTURE, OB REFLEX

## 2023-10-22 LAB — PANORAMA PRENATAL TEST FULL PANEL:PANORAMA TEST PLUS 5 ADDITIONAL MICRODELETIONS: FETAL FRACTION: 11.2

## 2023-10-29 ENCOUNTER — Encounter (HOSPITAL_BASED_OUTPATIENT_CLINIC_OR_DEPARTMENT_OTHER): Admitting: Certified Nurse Midwife

## 2023-11-01 ENCOUNTER — Ambulatory Visit (HOSPITAL_BASED_OUTPATIENT_CLINIC_OR_DEPARTMENT_OTHER): Admitting: Certified Nurse Midwife

## 2023-11-01 VITALS — BP 103/65 | HR 77 | Wt 204.4 lb

## 2023-11-01 DIAGNOSIS — Z3A13 13 weeks gestation of pregnancy: Secondary | ICD-10-CM

## 2023-11-01 DIAGNOSIS — Z3481 Encounter for supervision of other normal pregnancy, first trimester: Secondary | ICD-10-CM

## 2023-11-01 DIAGNOSIS — Z3480 Encounter for supervision of other normal pregnancy, unspecified trimester: Secondary | ICD-10-CM

## 2023-11-01 NOTE — Progress Notes (Signed)
    PRENATAL VISIT NOTE  Subjective:  Carol Clark is a 28 y.o. G2P1001 at [redacted]w[redacted]d being seen today for ongoing prenatal care.  She is currently monitored for the following issues for this  pregnancy and has Attention deficit disorder; Palpitations; Mixed anxiety and depressive disorder; Vitamin D  deficiency; Hypothyroidism; Hyperlipidemia; Subcutaneous nodule; Supervision of other normal pregnancy, antepartum; Thyroid  disease affecting pregnancy; Obesity during pregnancy; and History of palpitations in adulthood (Metoprolol  prn) on their problem list.  Patient reports no complaints.  Contractions: Not present. Vag. Bleeding: None.  Movement: Absent. Denies leaking of fluid.   The following portions of the patient's history were reviewed and updated as appropriate: allergies, current medications, past family history, past medical history, past social history, past surgical history and problem list.   Objective:    Vitals:   11/01/23 0814  BP: 103/65  Pulse: 77  Weight: 204 lb 6.4 oz (92.7 kg)    Fetal Status:  Fetal Heart Rate (bpm): 155   Movement: Absent    General: Alert, oriented and cooperative. Patient is in no acute distress.  Skin: Skin is warm and dry. No rash noted.   Cardiovascular: Normal heart rate noted  Respiratory: Normal respiratory effort, no problems with respiration noted  Abdomen: Soft, gravid, appropriate for gestational age.  Pain/Pressure: Absent     Pelvic: Cervical exam deferred        Extremities: Normal range of motion.  Edema: None  Mental Status: Normal mood and affect. Normal behavior. Normal judgment and thought content.   Assessment and Plan:  Pregnancy: G2P1001 at [redacted]w[redacted]d  1. Supervision of other normal pregnancy, antepartum (Primary) -Taking PNV - Start ASA 81mg  po once daily - Panorama Low-Risk, Female - US  MFM OB DETAIL +14 WK; Future (12/12/23)   2. Thyroid  disease affecting pregnancy - On Synthroid  50mcg daily at start of pregnancy -  Repeat TSH in 2nd Trimester (with AFP) - TSH (10/16/23): 3.750   3. Obesity during pregnancy - PreGravid BMI 32 - A1C Normal. No hx GDM - ASA 81mg  po once daily encouraged   4. [redacted] weeks gestation of pregnancy  - Pt encouraged to start ASA 81mg  po once daily   5. History of palpitations in adulthood (Metoprolol  prn) - Takes Metoprolol  prn - MFM Consult/antenatal testing   6. Complication of pregnancy, antepartum (Hx Palpitations, on Metoprolol  prn)    7. Hx Anxiety and Depression - Pt doing well on Zoloft  and Buspar  at this time (11/01/23).    Preterm labor symptoms and general obstetric precautions including but not limited to vaginal bleeding, contractions, leaking of fluid and fetal movement were reviewed in detail with the patient. Please refer to After Visit Summary for other counseling recommendations.    Future Appointments  Date Time Provider Department Center  11/26/2023  9:35 AM Cleotilde Ronal RAMAN, MD [facility]  12/12/2023  8:00 AM [facility] PROVIDER 1 [facility]  12/12/2023  8:30 AM [facility] US5 [facility]  12/26/2023  9:35 AM Jarrad Mclees, Arland POUR, CNM [facility]  01/31/2024  1:10 PM [place], Raymond J, MD [facility]    Arland POUR Roller, CNM

## 2023-11-07 ENCOUNTER — Other Ambulatory Visit (HOSPITAL_BASED_OUTPATIENT_CLINIC_OR_DEPARTMENT_OTHER): Payer: Self-pay | Admitting: Family Medicine

## 2023-11-07 DIAGNOSIS — F419 Anxiety disorder, unspecified: Secondary | ICD-10-CM

## 2023-11-19 ENCOUNTER — Ambulatory Visit (INDEPENDENT_AMBULATORY_CARE_PROVIDER_SITE_OTHER): Admitting: Obstetrics & Gynecology

## 2023-11-19 VITALS — BP 106/73 | HR 88 | Wt 202.8 lb

## 2023-11-19 DIAGNOSIS — O99212 Obesity complicating pregnancy, second trimester: Secondary | ICD-10-CM

## 2023-11-19 DIAGNOSIS — Z3A16 16 weeks gestation of pregnancy: Secondary | ICD-10-CM

## 2023-11-19 DIAGNOSIS — O99210 Obesity complicating pregnancy, unspecified trimester: Secondary | ICD-10-CM

## 2023-11-19 DIAGNOSIS — E039 Hypothyroidism, unspecified: Secondary | ICD-10-CM

## 2023-11-19 DIAGNOSIS — R002 Palpitations: Secondary | ICD-10-CM

## 2023-11-19 DIAGNOSIS — Z3480 Encounter for supervision of other normal pregnancy, unspecified trimester: Secondary | ICD-10-CM

## 2023-11-19 MED ORDER — METOPROLOL TARTRATE 25 MG PO TABS: 12.5000 mg | ORAL_TABLET | Freq: Two times a day (BID) | ORAL | 3 refills | Status: AC | PRN

## 2023-11-19 NOTE — Progress Notes (Signed)
   PRENATAL VISIT NOTE  Subjective:  Carol Clark is a 28 y.o. G2P1001 at [redacted]w[redacted]d being seen today for ongoing prenatal care.  She is currently monitored for the following issues for this low-risk pregnancy and has Attention deficit disorder; Palpitations; Mixed anxiety and depressive disorder; Vitamin D  deficiency; Hypothyroidism; Hyperlipidemia; Subcutaneous nodule; Supervision of other normal pregnancy, antepartum; Thyroid  disease affecting pregnancy; Obesity during pregnancy; and History of palpitations in adulthood (Metoprolol  prn) on their problem list.  Patient reports bloating more in this pregnancy than in comparison to her prior pregnancy.  Contractions: Not present. Vag. Bleeding: None.  Movement: Present. Denies leaking of fluid.   The following portions of the patient's history were reviewed and updated as appropriate: allergies, current medications, past family history, past medical history, past social history, past surgical history and problem list.   Objective:   Vitals:   11/19/23 1337  BP: 106/73  Pulse: 88  Weight: 202 lb 12.8 oz (92 kg)    Fetal Status: Fetal Heart Rate (bpm): 154    Movement: Present    General:  Alert, oriented and cooperative. Patient is in no acute distress.  Skin: Skin is warm and dry. No rash noted.   Cardiovascular: Normal heart rate noted  Respiratory: Normal respiratory effort, no problems with respiration noted  Abdomen: Soft, gravid, appropriate for gestational age.  Pain/Pressure: Absent     Pelvic: Cervical exam deferred        Extremities: Normal range of motion.  Edema: None  Mental Status: Normal mood and affect. Normal behavior. Normal judgment and thought content.   Assessment and Plan:  Pregnancy: G2P1001 at [redacted]w[redacted]d 1. [redacted] weeks gestation of pregnancy (Primary) - on PNV - AFP, Serum, Open Spina Bifida - f/u 4 weeks  2. Supervision of other normal pregnancy, antepartum  3. Palpitations - metoprolol  tartrate (LOPRESSOR ) 25  MG tablet; Take 0.5 tablets (12.5 mg total) by mouth 2 (two) times daily as needed.  Dispense: 60 tablet; Refill: 3  4. Acquired hypothyroidism - TSH normal with prenatal labs - on levothyroxine  50mcg daily  5. Obesity during pregnancy   Preterm labor symptoms and general obstetric precautions including but not limited to vaginal bleeding, contractions, leaking of fluid and fetal movement were reviewed in detail with the patient. Please refer to After Visit Summary for other counseling recommendations.   Return in about 4 weeks (around 12/17/2023).  Future Appointments  Date Time Provider Department Center  12/12/2023  8:00 AM [facility] PROVIDER 1 [facility]  12/12/2023  8:30 AM [facility] US5 [facility]  12/26/2023  9:35 AM Lo, Arland POUR, CNM [facility]  01/31/2024  1:10 PM [place], Raymond J, MD [facility]    Ronal GORMAN Pinal, MD

## 2023-11-21 ENCOUNTER — Ambulatory Visit (HOSPITAL_BASED_OUTPATIENT_CLINIC_OR_DEPARTMENT_OTHER): Payer: Self-pay | Admitting: Obstetrics & Gynecology

## 2023-11-21 DIAGNOSIS — Z3480 Encounter for supervision of other normal pregnancy, unspecified trimester: Secondary | ICD-10-CM

## 2023-11-21 LAB — AFP, SERUM, OPEN SPINA BIFIDA
AFP MoM: 0.69
AFP Value: 17.8 ng/mL
Gest. Age on Collection Date: 15.5 wk
Maternal Age At EDD: 30.4 a
OSBR Risk 1 IN: 10000
Test Results:: NEGATIVE
Weight: 202 [lb_av]

## 2023-11-26 ENCOUNTER — Encounter (HOSPITAL_BASED_OUTPATIENT_CLINIC_OR_DEPARTMENT_OTHER): Admitting: Obstetrics & Gynecology

## 2023-12-12 ENCOUNTER — Ambulatory Visit

## 2023-12-12 ENCOUNTER — Ambulatory Visit: Attending: Obstetrics and Gynecology | Admitting: Obstetrics

## 2023-12-12 ENCOUNTER — Other Ambulatory Visit: Payer: Self-pay | Admitting: *Deleted

## 2023-12-12 VITALS — BP 116/62 | HR 73

## 2023-12-12 DIAGNOSIS — Z363 Encounter for antenatal screening for malformations: Secondary | ICD-10-CM | POA: Diagnosis not present

## 2023-12-12 DIAGNOSIS — E079 Disorder of thyroid, unspecified: Secondary | ICD-10-CM | POA: Diagnosis not present

## 2023-12-12 DIAGNOSIS — Z3A19 19 weeks gestation of pregnancy: Secondary | ICD-10-CM | POA: Diagnosis not present

## 2023-12-12 DIAGNOSIS — Z362 Encounter for other antenatal screening follow-up: Secondary | ICD-10-CM

## 2023-12-12 DIAGNOSIS — O99282 Endocrine, nutritional and metabolic diseases complicating pregnancy, second trimester: Secondary | ICD-10-CM

## 2023-12-12 DIAGNOSIS — O99280 Endocrine, nutritional and metabolic diseases complicating pregnancy, unspecified trimester: Secondary | ICD-10-CM | POA: Insufficient documentation

## 2023-12-12 DIAGNOSIS — O99212 Obesity complicating pregnancy, second trimester: Secondary | ICD-10-CM | POA: Insufficient documentation

## 2023-12-12 DIAGNOSIS — E039 Hypothyroidism, unspecified: Secondary | ICD-10-CM

## 2023-12-12 DIAGNOSIS — E669 Obesity, unspecified: Secondary | ICD-10-CM

## 2023-12-12 DIAGNOSIS — O99210 Obesity complicating pregnancy, unspecified trimester: Secondary | ICD-10-CM

## 2023-12-12 DIAGNOSIS — Z3480 Encounter for supervision of other normal pregnancy, unspecified trimester: Secondary | ICD-10-CM | POA: Diagnosis not present

## 2023-12-12 DIAGNOSIS — O99412 Diseases of the circulatory system complicating pregnancy, second trimester: Secondary | ICD-10-CM | POA: Diagnosis not present

## 2023-12-12 DIAGNOSIS — O09891 Supervision of other high risk pregnancies, first trimester: Secondary | ICD-10-CM

## 2023-12-12 NOTE — Progress Notes (Signed)
 MFM Consult Note  Carol Clark is currently at 19 weeks and 0 days.  She was seen due to maternal obesity with a BMI of 32, history of hypothyroidism treated with Synthroid , and history of palpitations that is treated with metoprolol  as needed.  She denies any problems in her current pregnancy.    She had a cell free DNA test earlier in her pregnancy which indicated a low risk for trisomy 20, 39, and 13. A female fetus is predicted.   She was informed that the fetal growth and amniotic fluid level were appropriate for her gestational age.   There were no obvious fetal anomalies noted on today's ultrasound exam.  The patient was informed that anomalies may be missed due to technical limitations. If the fetus is in a suboptimal position or maternal habitus is increased, visualization of the fetus in the maternal uterus may be impaired.  Due to her underlying medical conditions, we will continue to follow her with growth ultrasounds throughout her pregnancy.  A follow-up exam was scheduled in 5 weeks.    The patient stated that all of her questions were answered today.  A total of 30 minutes was spent counseling and coordinating the care for this patient. Greater than 50% of the time was spent in direct face-to-face contact.

## 2023-12-17 ENCOUNTER — Ambulatory Visit (HOSPITAL_BASED_OUTPATIENT_CLINIC_OR_DEPARTMENT_OTHER): Admitting: Certified Nurse Midwife

## 2023-12-17 VITALS — BP 95/60 | HR 78 | Wt 204.6 lb

## 2023-12-17 DIAGNOSIS — O99282 Endocrine, nutritional and metabolic diseases complicating pregnancy, second trimester: Secondary | ICD-10-CM

## 2023-12-17 DIAGNOSIS — E039 Hypothyroidism, unspecified: Secondary | ICD-10-CM | POA: Diagnosis not present

## 2023-12-17 DIAGNOSIS — E079 Disorder of thyroid, unspecified: Secondary | ICD-10-CM

## 2023-12-17 DIAGNOSIS — O99212 Obesity complicating pregnancy, second trimester: Secondary | ICD-10-CM

## 2023-12-17 DIAGNOSIS — Z3480 Encounter for supervision of other normal pregnancy, unspecified trimester: Secondary | ICD-10-CM

## 2023-12-17 DIAGNOSIS — Z3A19 19 weeks gestation of pregnancy: Secondary | ICD-10-CM

## 2023-12-17 DIAGNOSIS — E669 Obesity, unspecified: Secondary | ICD-10-CM | POA: Diagnosis not present

## 2023-12-21 NOTE — Progress Notes (Signed)
   PRENATAL VISIT NOTE  Subjective:  Carol Clark is a 28 y.o. G2P1001 at [redacted]w[redacted]d being seen today for ongoing prenatal care.  She is currently monitored for the following issues for this  pregnancy and has Attention deficit disorder; Palpitations; Mixed anxiety and depressive disorder; Vitamin D  deficiency; Hypothyroidism; Hyperlipidemia; Subcutaneous nodule; Supervision of other normal pregnancy, antepartum; Thyroid  disease affecting pregnancy; Obesity during pregnancy; and History of palpitations in adulthood (Metoprolol  prn) on their problem list.  Patient reports no complaints.  Contractions: Not present. Vag. Bleeding: None.  Movement: Present. Denies leaking of fluid.   The following portions of the patient's history were reviewed and updated as appropriate: allergies, current medications, past family history, past medical history, past social history, past surgical history and problem list.   Objective:    Vitals:   12/17/23 1000  BP: 95/60  Pulse: 78  Weight: 204 lb 9.6 oz (92.8 kg)    Fetal Status:  Fetal Heart Rate (bpm): 140   Movement: Present    General: Alert, oriented and cooperative. Patient is in no acute distress.  Skin: Skin is warm and dry. No rash noted.   Cardiovascular: Normal heart rate noted  Respiratory: Normal respiratory effort, no problems with respiration noted  Abdomen: Soft, gravid, appropriate for gestational age.  Pain/Pressure: Present (some pelvic pressure around pelvic girdle)     Pelvic: Cervical exam deferred        Extremities: Normal range of motion.  Edema: None  Mental Status: Normal mood and affect. Normal behavior. Normal judgment and thought content.   Assessment and Plan:  Pregnancy: G2P1001 at [redacted]w[redacted]d  1. [redacted] weeks gestation of pregnancy (Primary) - on PNV - f/u 4 weeks   2. Supervision of other normal pregnancy, antepartum   3. Palpitations - metoprolol  tartrate (LOPRESSOR ) 25 MG tablet; Take 0.5 tablets (12.5 mg total) by mouth  2 (two) times daily as needed.  Dispense: 60 tablet; Refill: 3   4. Acquired hypothyroidism - TSH normal with prenatal labs - on levothyroxine  50mcg daily   5. Obesity during pregnancy  Preterm labor symptoms and general obstetric precautions including but not limited to vaginal bleeding, contractions, leaking of fluid and fetal movement were reviewed in detail with the patient. Please refer to After Visit Summary for other counseling recommendations.   No follow-ups on file.  Future Appointments  Date Time Provider Department Center  01/15/2024  9:15 AM Tad, Arland POUR, CNM [facility]  01/17/2024  9:15 AM [facility] PROVIDER 1 [facility]  01/17/2024  9:30 AM [facility] US3 [facility]  01/31/2024  1:10 PM [place], Raymond J, MD [facility]  02/11/2024  8:00 AM [facility] OBGYN LAB [facility]  02/11/2024  9:15 AM Cleotilde Ronal RAMAN, MD [facility]  02/25/2024  9:55 AM Cleotilde Ronal RAMAN, MD [facility]  03/10/2024 10:15 AM Cleotilde Ronal RAMAN, MD [facility]  04/14/2024 10:15 AM Cleotilde Ronal RAMAN, MD [facility]  04/21/2024 10:15 AM Cleotilde Ronal RAMAN, MD [facility]  04/28/2024 10:15 AM Cleotilde Ronal RAMAN, MD [facility]  05/05/2024 10:15 AM Cleotilde Ronal RAMAN, MD [facility]  05/12/2024 10:15 AM Cleotilde Ronal RAMAN, MD [facility]    Arland POUR Tad, CNM

## 2023-12-24 ENCOUNTER — Encounter (HOSPITAL_BASED_OUTPATIENT_CLINIC_OR_DEPARTMENT_OTHER): Admitting: Certified Nurse Midwife

## 2023-12-26 ENCOUNTER — Encounter (HOSPITAL_BASED_OUTPATIENT_CLINIC_OR_DEPARTMENT_OTHER): Admitting: Certified Nurse Midwife

## 2023-12-28 ENCOUNTER — Encounter (HOSPITAL_BASED_OUTPATIENT_CLINIC_OR_DEPARTMENT_OTHER): Payer: Self-pay | Admitting: Certified Nurse Midwife

## 2023-12-31 ENCOUNTER — Other Ambulatory Visit (HOSPITAL_BASED_OUTPATIENT_CLINIC_OR_DEPARTMENT_OTHER): Payer: Self-pay | Admitting: Certified Nurse Midwife

## 2023-12-31 DIAGNOSIS — O26892 Other specified pregnancy related conditions, second trimester: Secondary | ICD-10-CM

## 2024-01-01 ENCOUNTER — Ambulatory Visit: Attending: Certified Nurse Midwife | Admitting: Physical Therapy

## 2024-01-01 ENCOUNTER — Other Ambulatory Visit: Payer: Self-pay

## 2024-01-01 ENCOUNTER — Encounter: Payer: Self-pay | Admitting: Physical Therapy

## 2024-01-01 DIAGNOSIS — O26892 Other specified pregnancy related conditions, second trimester: Secondary | ICD-10-CM | POA: Insufficient documentation

## 2024-01-01 DIAGNOSIS — M6281 Muscle weakness (generalized): Secondary | ICD-10-CM | POA: Insufficient documentation

## 2024-01-01 DIAGNOSIS — R102 Pelvic and perineal pain: Secondary | ICD-10-CM | POA: Diagnosis not present

## 2024-01-01 DIAGNOSIS — R293 Abnormal posture: Secondary | ICD-10-CM | POA: Diagnosis present

## 2024-01-01 NOTE — Therapy (Signed)
 OUTPATIENT PHYSICAL THERAPY FEMALE PELVIC EVALUATION   Patient Name: Carol Clark MRN: [id number] DOB:1996-04-29, 28 y.o., female Today's Date: 01/01/2024  END OF SESSION:  PT End of Session - 01/01/24 1410     Visit Number 1    Authorization Type cigna [id number]  no auth req  medical necessity    PT Start Time 1358    PT Stop Time 1450    PT Time Calculation (min) 52 min    Activity Tolerance Patient tolerated treatment well    Behavior During Therapy [facility] for tasks assessed/performed          Past Medical History:  Diagnosis Date   ADHD    Anxiety    Insomnia 11/22/2020   Palpitations 11/22/2020   Status post vacuum-assisted vaginal delivery 04/08/2021   Delivery 04/08/21   Vacuum-assisted vaginal delivery 2022   Past Surgical History:  Procedure Laterality Date   NO PAST SURGERIES     Patient Active Problem List   Diagnosis Date Noted   Supervision of other normal pregnancy, antepartum 10/16/2023   Thyroid  disease affecting pregnancy 10/16/2023   Obesity during pregnancy 10/16/2023   History of palpitations in adulthood (Metoprolol  prn) 10/16/2023   Subcutaneous nodule 01/01/2023   Hypothyroidism 10/05/2022   Hyperlipidemia 10/05/2022   Mixed anxiety and depressive disorder 07/06/2021   Vitamin D  deficiency 07/06/2021   Palpitations 11/22/2020   Attention deficit disorder 01/27/2016    PCP: de [place], Raymond J, MD  REFERRING PROVIDER: Tad Arland POUR, CNM  REFERRING DIAG: [phone] (ICD-10-CM) - Pelvic pain affecting pregnancy in second trimester, antepartum  THERAPY DIAG:  Muscle weakness (generalized)  Abnormal posture  Rationale for Evaluation and Treatment: Rehabilitation  ONSET DATE: 11/2023  SUBJECTIVE:                                                                                                                                                                                           SUBJECTIVE STATEMENT: Patient reports that she has pelvic  floor area pain, sometimes sharp, stabbing, sometimes dull ache.  More around vaginal labial area.  Second baby 22 weeks today. A girl, she has a 2 yo at home Standing and not moving is tolerable, walking and side stepping starts hurting   PAIN:  Are you having pain? Yes NPRS scale: 6-7/10 Pain location: Internal, External, and Vaginal  Pain type: aching and sharp Pain description: intermittent   Aggravating factors: side stepping, shifting weight, standing for long periods of time Relieving factors: res  PRECAUTIONS: None  RED FLAGS: None   WEIGHT BEARING RESTRICTIONS: No  FALLS:  Has patient fallen in last 6 months? No  OCCUPATION: pharmacist, lot of standing in one spot  ACTIVITY LEVEL : does not exercise, but is active  PLOF: Independent  PATIENT GOALS: get to the point where she can stand longer at work, has to sit down  PERTINENT HISTORY:  Vaginal delivery 2022 Sexual abuse: No  BOWEL MOVEMENT: no issues  URINATION:  Pain with urination: No Fully empty bladder: Yes:   Stream: Strong Urgency: Yes  Frequency: yes Fluid Intake:  Leakage: Coughing and Sneezing Pads: Yes: 1/ day- got worse during this pregnancy  INTERCOURSE:  Ability to have vaginal penetration No   PREGNANCY: Vaginal deliveries 1 Tearing Yes: 2nd degree Episiotomy No C-section deliveries no Currently pregnant Yes:    PROLAPSE: Pressure and heaviness   OBJECTIVE:  Note: Objective measures were completed at Evaluation unless otherwise noted.   PATIENT SURVEYS:   PFIQ-7: 117  COGNITION: Overall cognitive status: Within functional limits for tasks assessed     SENSATION: Light touch: Appears intact   FUNCTIONAL TESTS:  Squat: WFL Single leg stance:  Rt: knee valgus, hip shift  Ou:xwzz valgus, hip shift - reproduces symptoms Curl-up test: 1/3  Breath holding strategies   GAIT: Assistive device utilized: None Comments: painful without Serola belt  POSTURE:  rounded shoulders, forward head, and increased lumbar lordosis   LUMBARAROM/PROM:  A/PROM A/PROM  Eval (% available)  Flexion 90  Extension 90  Right lateral flexion 75  Left lateral flexion 75  Right rotation 75  Left rotation 75   (Blank rows = not tested)  PALPATION:   General: decreased lateral rib expansion  Pelvic Alignment: even  Abdominal: pregnant                 External Perineal Exam: no dryness noticed                             Internal Pelvic Floor: weak contraction, able to contract/ relax/ bulge, right levator reduced   Patient confirms identification and approves PT to assess internal pelvic floor and treatment Yes Patient confirms identification and approves PT to assess internal pelvic floor and treatment Yes No emotional/communication barriers or cognitive limitation. Patient is motivated to learn. Patient understands and agrees with treatment goals and plan. PT explains patient will be examined in standing, sitting, and lying down to see how their muscles and joints work. When they are ready, they will be asked to remove their underwear so PT can examine their perineum. The patient is also given the option of providing their own chaperone as one is not provided in our facility. The patient also has the right and is explained the right to defer or refuse any part of the evaluation or treatment including the internal exam. With the patient's consent, PT will use one gloved finger to gently assess the muscles of the pelvic floor, seeing how well it contracts and relaxes and if there is muscle symmetry. After, the patient will get dressed and PT and patient will discuss exam findings and plan of care. PT and patient discuss plan of care, schedule, attendance policy and HEP activities.      PELVIC MMT:   MMT eval  Vaginal 3/5, 5 sec  Internal Anal Sphincter   External Anal Sphincter   Puborectalis   Diastasis Recti 1 finger  (Blank rows = not tested)         TONE: average  PROLAPSE: Mild anterior vaginal wall laxity in hook lying with bladder descent  with cough  TODAY'S TREATMENT:                                                                                                                              DATE:  01/01/2024    PATIENT EDUCATION/ there acts Education details: Pt was educated on relevant anatomy, exam findings, home exercise program, plan of care, expectations of PT and Serola belt- tried on, walked around, did transfers  Person educated: Patient Education method: Explanation, Demonstration, Tactile cues, Verbal cues, and Handouts Education comprehension: verbalized understanding  HOME EXERCISE PROGRAM: Access Code: [email or web address] URL: [email or web address] Date: 01/01/2024 Prepared by: Cori An Schnabel  Program Notes exhale on exertion  Exercises - Abdominal Press into [place]  - 1 x daily - 7 x weekly - 2 sets - 10 reps - Seated Abdominal Press into [employer]  - 1 x daily - 7 x weekly - 2 sets - 10 reps - Diaphragmatic Breathing to Reduce Intra-abdominal Pressure: Lifting a Basket  - 1 x daily - 7 x weekly - 2 sets - 10 reps - Diaphragmatic Breathing to Reduce Intra-abdominal Pressure: Sit to Stand  - 1 x daily - 7 x weekly - 2 sets - 10 reps - Pelvic Floor Contractions in Hooklying with Adduction  - 1 x daily - 7 x weekly - 2 sets - 10 reps  ASSESSMENT:  CLINICAL IMPRESSION: Patient is a 29 y.o. F who was seen today for physical therapy evaluation and treatment for pelvic girdle pain in pregnancy. Findings notable for bilateral hip and pelvic floor weakness, decreased lumbo pelvic stability, bladder descent with cough, good bilateral hip PROM, patient able to contract pelvic floor better with exhale checked internally. She did well with trial of Serola belt with reduced groin pain and improved ease of movement. Patient is [redacted] weeks pregnant, had tearing with first baby and right side of levator muscles are reduced.  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 She will benefit from PT to address deficits  OBJECTIVE IMPAIRMENTS: decreased activity tolerance, decreased coordination, decreased endurance, decreased mobility, decreased ROM, decreased strength, increased fascial restrictions, increased muscle spasms, impaired flexibility, impaired tone, improper body mechanics, postural dysfunction, and pain.   ACTIVITY LIMITATIONS: continence and toileting  PARTICIPATION LIMITATIONS: occupation  PERSONAL FACTORS: Profession and Time since onset of injury/illness/exacerbation are also affecting patient's functional outcome.   REHAB POTENTIAL: Good  CLINICAL DECISION MAKING: Evolving/moderate complexity  EVALUATION COMPLEXITY: Moderate   GOALS: Goals reviewed with patient? Yes  SHORT TERM GOALS: Target date: 01/29/2024    Pt will be independent with HEP.   Baseline: Goal status: INITIAL  2.  Patient will be I with using Serola or another support belt Baseline:  Goal status: INITIAL  3.  Patient will be educated on the knack to reduce SUI Baseline:  Goal status: INITIAL  4.  Patient  will be able to stand and walk at least 1 hour without increased low back pain or pelvic girdle pain Baseline:  Goal status: INITIAL   LONG TERM GOALS: Target date: 07/04/2023  Pt will be independent with advanced HEP.   Baseline:  Goal status: INITIAL  2.  Patient will be able to stand at work as needed without increased pelvic or low back pain Baseline:  Goal status: INITIAL  3.  Patient will soak 0 pads/ day with sneezing and coughing  Baseline: 1 Goal status: INITIAL  4.  Patient will have PFIQ- 7 reduced to 20 points Baseline: 117 Goal status: INITIAL  5.  Patient will demonstrate improved hip strength bilateral to 5/5 Baseline:  Goal status: INITIAL  6.  Patient will be  educated on birth prep positions for labor and I with perineal massage  Baseline:  Goal status: INITIAL            7. Patient will be  I with perineal massage                                      Baseline:  Goal status: INITIAL    PT FREQUENCY: 1-2x/week  PT DURATION: 6 months    PLANNED INTERVENTIONS: 97164- PT Re-evaluation, 97110-Therapeutic exercises, 97530- Therapeutic activity, 97112- Neuromuscular re-education, 97535- Self Care, 02859- Manual therapy, [phone]- Gait training, [phone]- Aquatic Therapy, [phone]- Electrical stimulation (unattended), [phone]- Traction (mechanical), [id number]- Ionotophoresis 4mg /ml Dexamethasone, 79439 (1-2 muscles), 20561 (3+ muscles)- Dry Needling, Patient/Family education, Balance training, Taping, Joint mobilization, Joint manipulation, Spinal manipulation, Spinal mobilization, Scar mobilization, Vestibular training, Cryotherapy, Moist heat, and Biofeedback  PLAN FOR NEXT SESSION: pressure management exercises for core strengthening, birth prep info, perineal massage education   Diamante Truszkowski, PT 01/01/2024, 3:12 PM

## 2024-01-15 ENCOUNTER — Encounter (HOSPITAL_BASED_OUTPATIENT_CLINIC_OR_DEPARTMENT_OTHER): Admitting: Certified Nurse Midwife

## 2024-01-17 ENCOUNTER — Ambulatory Visit

## 2024-01-21 ENCOUNTER — Other Ambulatory Visit: Payer: Self-pay | Admitting: *Deleted

## 2024-01-21 ENCOUNTER — Ambulatory Visit (HOSPITAL_BASED_OUTPATIENT_CLINIC_OR_DEPARTMENT_OTHER)

## 2024-01-21 ENCOUNTER — Ambulatory Visit: Attending: Obstetrics and Gynecology | Admitting: Obstetrics

## 2024-01-21 VITALS — BP 107/62 | HR 84

## 2024-01-21 DIAGNOSIS — Z362 Encounter for other antenatal screening follow-up: Secondary | ICD-10-CM | POA: Diagnosis not present

## 2024-01-21 DIAGNOSIS — O99282 Endocrine, nutritional and metabolic diseases complicating pregnancy, second trimester: Secondary | ICD-10-CM | POA: Insufficient documentation

## 2024-01-21 DIAGNOSIS — O99212 Obesity complicating pregnancy, second trimester: Secondary | ICD-10-CM | POA: Diagnosis not present

## 2024-01-21 DIAGNOSIS — E669 Obesity, unspecified: Secondary | ICD-10-CM

## 2024-01-21 DIAGNOSIS — E039 Hypothyroidism, unspecified: Secondary | ICD-10-CM | POA: Diagnosis not present

## 2024-01-21 DIAGNOSIS — Z3A24 24 weeks gestation of pregnancy: Secondary | ICD-10-CM | POA: Diagnosis not present

## 2024-01-21 DIAGNOSIS — O99412 Diseases of the circulatory system complicating pregnancy, second trimester: Secondary | ICD-10-CM | POA: Insufficient documentation

## 2024-01-21 DIAGNOSIS — O99210 Obesity complicating pregnancy, unspecified trimester: Secondary | ICD-10-CM

## 2024-01-21 DIAGNOSIS — Z363 Encounter for antenatal screening for malformations: Secondary | ICD-10-CM | POA: Diagnosis not present

## 2024-01-21 DIAGNOSIS — E079 Disorder of thyroid, unspecified: Secondary | ICD-10-CM

## 2024-01-21 NOTE — Progress Notes (Signed)
 MFM Consult Note  Carol Clark is currently at 24 weeks and 5 days.  She has been followed due to maternal obesity with a BMI of 32, history of hypothyroidism treated with Synthroid , and history of palpitations that is treated with metoprolol  as needed.  She denies any problems since her last exam.  Sonographic findings Single intrauterine pregnancy at 24w 5d.  Fetal cardiac activity:  Observed and appears normal. Presentation: Cephalic. Interval fetal anatomy appears normal. Fetal biometry shows the estimated fetal weight of 1 pound 7 ounces which measures at the 17th percentile. Amniotic fluid volume: Within normal limits. MVP: 6.74 cm. Placenta: Posterior.  There are no obvious fetal anomalies noted on today's exam. The limitations of ultrasound in the detection of all anomalies was discussed.  Due to her underlying medical conditions, we will continue to follow her with growth ultrasounds throughout her pregnancy.  A follow-up exam was scheduled in 8 weeks.    The patient stated that all of her questions were answered today.  A total of 20 minutes was spent counseling and coordinating the care for this patient.  Greater than 50% of the time was spent in direct face-to-face contact.

## 2024-01-24 ENCOUNTER — Encounter: Payer: Self-pay | Admitting: Physical Therapy

## 2024-01-24 ENCOUNTER — Ambulatory Visit: Payer: Self-pay | Attending: Certified Nurse Midwife | Admitting: Physical Therapy

## 2024-01-24 DIAGNOSIS — R293 Abnormal posture: Secondary | ICD-10-CM | POA: Diagnosis present

## 2024-01-24 DIAGNOSIS — M6281 Muscle weakness (generalized): Secondary | ICD-10-CM | POA: Insufficient documentation

## 2024-01-24 NOTE — Therapy (Addendum)
 OUTPATIENT PHYSICAL THERAPY FEMALE PELVIC TREATMENT   Patient Name: Carol Clark MRN: [id number] DOB:05/03/1996, 28 y.o., female Today's Date: 01/24/2024  END OF SESSION:  PT End of Session - 01/24/24 1357     Visit Number 2    Authorization Type cigna [id number]  no auth req  medical necessity    PT Start Time 1358    PT Stop Time 1440    PT Time Calculation (min) 42 min    Activity Tolerance Patient tolerated treatment well    Behavior During Therapy [facility] for tasks assessed/performed          Past Medical History:  Diagnosis Date   ADHD    Anxiety    Insomnia 11/22/2020   Palpitations 11/22/2020   Status post vacuum-assisted vaginal delivery 04/08/2021   Delivery 04/08/21   Vacuum-assisted vaginal delivery 2022   Past Surgical History:  Procedure Laterality Date   NO PAST SURGERIES     Patient Active Problem List   Diagnosis Date Noted   Supervision of other normal pregnancy, antepartum 10/16/2023   Thyroid  disease affecting pregnancy 10/16/2023   Obesity during pregnancy 10/16/2023   History of palpitations in adulthood (Metoprolol  prn) 10/16/2023   Subcutaneous nodule 01/01/2023   Hypothyroidism 10/05/2022   Hyperlipidemia 10/05/2022   Mixed anxiety and depressive disorder 07/06/2021   Vitamin D  deficiency 07/06/2021   Palpitations 11/22/2020   Attention deficit disorder 01/27/2016    PCP: de [place], Raymond J, MD  REFERRING PROVIDER: Tad Arland POUR, CNM  REFERRING DIAG: [phone] (ICD-10-CM) - Pelvic pain affecting pregnancy in second trimester, antepartum  THERAPY DIAG:  Muscle weakness (generalized)  Abnormal posture  Rationale for Evaluation and Treatment: Rehabilitation  ONSET DATE: 11/2023  SUBJECTIVE:                                                                                                                                                                                           SUBJECTIVE STATEMENT: Patient reports that she is doing so much  better, exhaling on exertion has made a big difference. Walking and side stepping is ok.  She reports that she is tired today, 25 weeks today.    From last session Patient reports that she has pelvic floor area pain, sometimes sharp, stabbing, sometimes dull ache.  More around vaginal labial area.  Second baby 22 weeks today. A girl, she has a 2 yo at home Standing and not moving is tolerable, walking and side stepping starts hurting   PAIN:  Are you having pain? Yes NPRS scale: 6-7/10 Pain location: Internal, External, and Vaginal  Pain type: aching and sharp Pain description: intermittent  Aggravating factors: side stepping, shifting weight, standing for long periods of time Relieving factors: res  PRECAUTIONS: None  RED FLAGS: None   WEIGHT BEARING RESTRICTIONS: No  FALLS:  Has patient fallen in last 6 months? No  OCCUPATION: pharmacist, lot of standing in one spot  ACTIVITY LEVEL : does not exercise, but is active  PLOF: Independent  PATIENT GOALS: get to the point where she can stand longer at work, has to sit down  PERTINENT HISTORY:  Vaginal delivery 2022 Sexual abuse: No  BOWEL MOVEMENT: no issues  URINATION:  Pain with urination: No Fully empty bladder: Yes:   Stream: Strong Urgency: Yes  Frequency: yes Fluid Intake:  Leakage: Coughing and Sneezing Pads: Yes: 1/ day- got worse during this pregnancy  INTERCOURSE:  Ability to have vaginal penetration No   PREGNANCY: Vaginal deliveries 1 Tearing Yes: 2nd degree Episiotomy No C-section deliveries no Currently pregnant Yes:    PROLAPSE: Pressure and heaviness   OBJECTIVE:  Note: Objective measures were completed at Evaluation unless otherwise noted.   PATIENT SURVEYS:   PFIQ-7: 117  COGNITION: Overall cognitive status: Within functional limits for tasks assessed     SENSATION: Light touch: Appears intact   FUNCTIONAL TESTS:  Squat: WFL Single leg stance:  Rt: knee valgus,  hip shift  Ou:xwzz valgus, hip shift - reproduces symptoms Curl-up test: 1/3  Breath holding strategies   GAIT: Assistive device utilized: None Comments: painful without Serola belt  POSTURE: rounded shoulders, forward head, and increased lumbar lordosis   LUMBARAROM/PROM:  A/PROM A/PROM  Eval (% available)  Flexion 90  Extension 90  Right lateral flexion 75  Left lateral flexion 75  Right rotation 75  Left rotation 75   (Blank rows = not tested)  PALPATION:   General: decreased lateral rib expansion  Pelvic Alignment: even  Abdominal: pregnant                 External Perineal Exam: no dryness noticed                             Internal Pelvic Floor: weak contraction, able to contract/ relax/ bulge, right levator reduced   Patient confirms identification and approves PT to assess internal pelvic floor and treatment Yes Patient confirms identification and approves PT to assess internal pelvic floor and treatment Yes No emotional/communication barriers or cognitive limitation. Patient is motivated to learn. Patient understands and agrees with treatment goals and plan. PT explains patient will be examined in standing, sitting, and lying down to see how their muscles and joints work. When they are ready, they will be asked to remove their underwear so PT can examine their perineum. The patient is also given the option of providing their own chaperone as one is not provided in our facility. The patient also has the right and is explained the right to defer or refuse any part of the evaluation or treatment including the internal exam. With the patient's consent, PT will use one gloved finger to gently assess the muscles of the pelvic floor, seeing how well it contracts and relaxes and if there is muscle symmetry. After, the patient will get dressed and PT and patient will discuss exam findings and plan of care. PT and patient discuss plan of care, schedule, attendance policy and HEP  activities.      PELVIC MMT:   MMT eval  Vaginal 3/5, 5 sec  Internal Anal Sphincter  External Anal Sphincter   Puborectalis   Diastasis Recti 1 finger  (Blank rows = not tested)        TONE: average  PROLAPSE: Mild anterior vaginal wall laxity in hook lying with bladder descent with cough  TODAY'S TREATMENT:                                                                                                                              DATE:  01/24/2024 Trial of serola belt Neuro reed- 2x10 hip adduction with ball with transverse abdominis breath VC's for chin tuck Bilateral knee fallout 20 reps with toes up black loop Supine march with black loop with toes up Side lying clam 20 reps Supine ball press with transverse abdominis breath 20 reps     01/01/2024    PATIENT EDUCATION/ there acts Education details: Pt was educated on relevant anatomy, exam findings, home exercise program, plan of care, expectations of PT and Serola belt- tried on, walked around, did transfers  Person educated: Patient Education method: Explanation, Demonstration, Tactile cues, Verbal cues, and Handouts Education comprehension: verbalized understanding  HOME EXERCISE PROGRAM: Access Code: [email or web address] URL: [email or web address] Date: 01/01/2024 Prepared by: Cori Deidrea Gaetz  Program Notes exhale on exertion  Exercises - Abdominal Press into [place]  - 1 x daily - 7 x weekly - 2 sets - 10 reps - Seated Abdominal Press into [employer]  - 1 x daily - 7 x weekly - 2 sets - 10 reps - Diaphragmatic Breathing to Reduce Intra-abdominal Pressure: Lifting a Basket  - 1 x daily - 7 x weekly - 2 sets - 10 reps - Diaphragmatic Breathing to Reduce Intra-abdominal Pressure: Sit to Stand  - 1 x daily - 7 x weekly - 2 sets - 10 reps - Pelvic Floor Contractions in Hooklying with Adduction  - 1 x daily - 7 x weekly - 2 sets - 10 reps  ASSESSMENT:  CLINICAL IMPRESSION: Patient was seen today for  treatment of SUI and pelvic pain in pregnancy. Patient with much improved pain, urinary frequency and leaking. Patient did well with exercises, manual therapy and education today. We discussed progress and recommended cont consistency with HEP. Treatment session focused on core strengthening to improve SUI. Patient had minimal difficulty with exercises. Patient is progressing well towards goals and will benefit from continued PT to address deficits, reduce pain and improve quality of life.       Last session Patient is a 28 y.o. F who was seen today for physical therapy evaluation and treatment for pelvic girdle pain in pregnancy. Findings notable for bilateral hip and pelvic floor weakness, decreased lumbo pelvic stability, bladder descent with cough, good bilateral hip PROM, patient able to contract pelvic floor better with exhale checked internally. She did well with trial of Serola belt with reduced groin pain and improved ease of movement. Patient is [redacted] weeks pregnant, had tearing with first baby and right side of levator muscles are  reduced. 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 She will benefit from PT to address deficits  OBJECTIVE IMPAIRMENTS: decreased activity tolerance, decreased coordination, decreased endurance, decreased mobility, decreased ROM, decreased strength, increased fascial restrictions, increased muscle spasms, impaired flexibility, impaired tone, improper body mechanics, postural dysfunction, and pain.   ACTIVITY LIMITATIONS: continence and toileting  PARTICIPATION LIMITATIONS: occupation  PERSONAL FACTORS: Profession and Time since onset of injury/illness/exacerbation are also affecting patient's functional outcome.   REHAB POTENTIAL: Good  CLINICAL DECISION MAKING: Evolving/moderate complexity  EVALUATION COMPLEXITY: Moderate   GOALS: Goals  reviewed with patient? Yes  SHORT TERM GOALS: Target date: 01/29/2024    Pt will be independent with HEP.   Baseline: Goal status: met  2.  Patient will be I with using Serola or another support belt Baseline:  Goal status: met  3.  Patient will be educated on the knack to reduce SUI Baseline:  Goal status: INITIAL  4.  Patient will be able to stand and walk at least 1 hour without increased low back pain or pelvic girdle pain Baseline:  Goal status: met 01/16/24   LONG TERM GOALS: Target date: 07/04/2023  Pt will be independent with advanced HEP.   Baseline:  Goal status: INITIAL  2.  Patient will be able to stand at work as needed without increased pelvic or low back pain Baseline:  Goal status: INITIAL  3.  Patient will soak 0 pads/ day with sneezing and coughing  Baseline: 1 Goal status: INITIAL  4.  Patient will have PFIQ- 7 reduced to 20 points Baseline: 117 Goal status: INITIAL  5.  Patient will demonstrate improved hip strength bilateral to 5/5 Baseline:  Goal status: INITIAL  6.  Patient will be educated on birth prep positions for labor and I with perineal massage  Baseline:  Goal status: INITIAL            7. Patient will be  I with perineal massage                                      Baseline:  Goal status: INITIAL    PT FREQUENCY: 1-2x/week  PT DURATION: 6 months    PLANNED INTERVENTIONS: 97164- PT Re-evaluation, 97110-Therapeutic exercises, 97530- Therapeutic activity, 97112- Neuromuscular re-education, 97535- Self Care, 02859- Manual therapy, [id number]- Gait training, [phone]- Aquatic Therapy, [id number]- Electrical stimulation (unattended), [id number]- Traction (mechanical), [id number]- Ionotophoresis 4mg /ml Dexamethasone, 79439 (1-2 muscles), 20561 (3+ muscles)- Dry Needling, Patient/Family education, Balance training, Taping, Joint mobilization, Joint manipulation, Spinal manipulation, Spinal mobilization, Scar mobilization, Vestibular training, Cryotherapy,  Moist heat, and Biofeedback  PLAN FOR NEXT SESSION: pressure management exercises for core strengthening, birth prep info, perineal massage education   Dalayla Aldredge, PT 01/24/2024, 1:58 PM

## 2024-01-25 ENCOUNTER — Telehealth (HOSPITAL_BASED_OUTPATIENT_CLINIC_OR_DEPARTMENT_OTHER): Payer: Self-pay

## 2024-01-25 NOTE — Telephone Encounter (Signed)
 Patient left a voicemail on the nurse line requesting a call back due to chest tightness. Spoke with patient who reports she began having intermittent chest tightness a few hours ago. It is occurring only with standing and is causing mild SOB. Reports when she sits down symptoms are relieved. Patient ate a biscuit for breakfast and wonders if this could be indigestion. Denies any chest pain, radiating pain, or dizziness. Advised okay to take OTC pepcid ac or prilosec to see if this relieves symptoms. Advised if symptoms persist/worsen or she develops any new symptoms she will need to be evaluated at [facility]. Patient is agreeable.

## 2024-01-29 ENCOUNTER — Encounter (HOSPITAL_BASED_OUTPATIENT_CLINIC_OR_DEPARTMENT_OTHER): Payer: Self-pay | Admitting: Obstetrics & Gynecology

## 2024-01-29 ENCOUNTER — Ambulatory Visit: Payer: Self-pay | Admitting: Physical Therapy

## 2024-01-31 ENCOUNTER — Encounter (HOSPITAL_BASED_OUTPATIENT_CLINIC_OR_DEPARTMENT_OTHER): Payer: Managed Care, Other (non HMO) | Admitting: Family Medicine

## 2024-02-06 ENCOUNTER — Ambulatory Visit (INDEPENDENT_AMBULATORY_CARE_PROVIDER_SITE_OTHER): Admitting: Obstetrics & Gynecology

## 2024-02-06 VITALS — BP 111/73 | HR 85 | Wt 196.4 lb

## 2024-02-06 DIAGNOSIS — O99282 Endocrine, nutritional and metabolic diseases complicating pregnancy, second trimester: Secondary | ICD-10-CM

## 2024-02-06 DIAGNOSIS — M5441 Lumbago with sciatica, right side: Secondary | ICD-10-CM | POA: Diagnosis not present

## 2024-02-06 DIAGNOSIS — Z3A27 27 weeks gestation of pregnancy: Secondary | ICD-10-CM

## 2024-02-06 DIAGNOSIS — O99891 Other specified diseases and conditions complicating pregnancy: Secondary | ICD-10-CM

## 2024-02-06 DIAGNOSIS — Z3480 Encounter for supervision of other normal pregnancy, unspecified trimester: Secondary | ICD-10-CM

## 2024-02-06 DIAGNOSIS — E079 Disorder of thyroid, unspecified: Secondary | ICD-10-CM

## 2024-02-06 NOTE — Progress Notes (Deleted)
 Reports tingling in legs that starts in lower back that began a week ago.

## 2024-02-06 NOTE — Progress Notes (Signed)
   PRENATAL VISIT NOTE  Subjective:  Carol Clark is a 28 y.o. G2P1001 at [redacted]w[redacted]d being seen today for ongoing prenatal care.  She is currently monitored for the following issues for this low-risk pregnancy and has Attention deficit disorder; Palpitations; Mixed anxiety and depressive disorder; Vitamin D  deficiency; Hypothyroidism; Hyperlipidemia; Subcutaneous nodule; Supervision of other normal pregnancy, antepartum; Thyroid  disease affecting pregnancy; Obesity during pregnancy; and History of palpitations in adulthood (Metoprolol  prn) on their problem list.  This appointment is out of synch with regular ob appt due to having numbness/pain lown right leg and in foot.  Having some lower back pain as well.  Not feeling like she needs to move leg to get comfortable.  No swelling.  Contractions: Not present. Vag. Bleeding: None.  Movement: Present. Denies leaking of fluid.   The following portions of the patient's history were reviewed and updated as appropriate: allergies, current medications, past family history, past medical history, past social history, past surgical history and problem list.   Objective:    Vitals:   02/06/24 1333  BP: 111/73  Pulse: 85  Weight: 196 lb 6.4 oz (89.1 kg)    Fetal Status:  Fetal Heart Rate (bpm): 153   Movement: Present    General: Alert, oriented and cooperative. Patient is in no acute distress.  Skin: Skin is warm and dry. No rash noted.   Cardiovascular: Normal heart rate noted  Respiratory: Normal respiratory effort, no problems with respiration noted  Abdomen: Soft, gravid, appropriate for gestational age.  Pain/Pressure: Absent     Pelvic: Cervical exam deferred        Extremities: Normal range of motion.  Edema: None  Mental Status: Normal mood and affect. Normal behavior. Normal judgment and thought content.   Assessment and Plan:  Pregnancy: G2P1001 at [redacted]w[redacted]d 1. Acute right-sided low back pain with right-sided sciatica (Primary) - discussed  evaluation and treatment with PT - AMB referral to sports medicine  2. Supervision of other normal pregnancy, antepartum - return for 28 week appt - plan Tdap and lab work - HIV Antibody (routine testing w rflx) - RPR - CBC - Glucose Tolerance, 2 Hours w/1 Hour  3. [redacted] weeks gestation of pregnancy  4. Thyroid  disease affecting pregnancy - TSH; Future  Preterm labor symptoms and general obstetric precautions including but not limited to vaginal bleeding, contractions, leaking of fluid and fetal movement were reviewed in detail with the patient. Please refer to After Visit Summary for other counseling recommendations.   No follow-ups on file.  Future Appointments  Date Time Provider Department Center  02/11/2024  8:30 AM [id number]  02/11/2024  9:15 AM Cleotilde Ronal RAMAN, MD [facility] [address]  02/14/2024 12:30 PM Helmus, Jitka, PT [facility] None  02/20/2024  9:30 AM Helmus, Jitka, PT [facility] None  02/25/2024 11:45 AM Helmus, Jitka, PT [facility] None  02/26/2024 10:35 AM Cleotilde Ronal RAMAN, MD [facility]  03/11/2024 10:15 AM Tad, Arland POUR, CNM [facility] [address]  03/19/2024  8:15 AM [facility] PROVIDER 1 [facility]  03/19/2024  8:30 AM [facility] US2 [facility]  04/14/2024 10:15 AM Cleotilde Ronal RAMAN, MD [facility] [address]  04/21/2024 10:55 AM Tad, Arland POUR, CNM [facility] [address]  04/28/2024 10:15 AM Cleotilde Ronal RAMAN, MD [facility] [address]  05/05/2024 10:15 AM Cleotilde Ronal RAMAN, MD [facility] [address]  05/12/2024 10:15 AM Cleotilde Ronal RAMAN, MD [facility] [id number] [facility]    Ronal RAMAN Cleotilde, MD

## 2024-02-09 NOTE — Addendum Note (Signed)
 Addended by: CLEOTILDE RONAL RAMAN on: 02/09/2024 05:36 PM   Modules accepted: Level of Service

## 2024-02-11 ENCOUNTER — Ambulatory Visit (HOSPITAL_BASED_OUTPATIENT_CLINIC_OR_DEPARTMENT_OTHER): Admitting: Obstetrics & Gynecology

## 2024-02-11 ENCOUNTER — Encounter (HOSPITAL_BASED_OUTPATIENT_CLINIC_OR_DEPARTMENT_OTHER): Admitting: Certified Nurse Midwife

## 2024-02-11 ENCOUNTER — Other Ambulatory Visit (HOSPITAL_BASED_OUTPATIENT_CLINIC_OR_DEPARTMENT_OTHER)

## 2024-02-11 VITALS — BP 104/71 | HR 86 | Wt 196.0 lb

## 2024-02-11 DIAGNOSIS — E039 Hypothyroidism, unspecified: Secondary | ICD-10-CM | POA: Diagnosis not present

## 2024-02-11 DIAGNOSIS — O99282 Endocrine, nutritional and metabolic diseases complicating pregnancy, second trimester: Secondary | ICD-10-CM | POA: Diagnosis not present

## 2024-02-11 DIAGNOSIS — F418 Other specified anxiety disorders: Secondary | ICD-10-CM

## 2024-02-11 DIAGNOSIS — O99342 Other mental disorders complicating pregnancy, second trimester: Secondary | ICD-10-CM

## 2024-02-11 DIAGNOSIS — Z3480 Encounter for supervision of other normal pregnancy, unspecified trimester: Secondary | ICD-10-CM

## 2024-02-11 DIAGNOSIS — Z8679 Personal history of other diseases of the circulatory system: Secondary | ICD-10-CM

## 2024-02-11 DIAGNOSIS — Z3A27 27 weeks gestation of pregnancy: Secondary | ICD-10-CM

## 2024-02-11 NOTE — Progress Notes (Deleted)
 Offer Tdap and do PHQ/GAD

## 2024-02-11 NOTE — Progress Notes (Signed)
    PRENATAL VISIT NOTE  Subjective:  Carol Clark is a 28 y.o. G2P1001 at [redacted]w[redacted]d being seen today for ongoing prenatal care.  She is currently monitored for the following issues for this low-risk pregnancy and has Attention deficit disorder; Palpitations; Mixed anxiety and depressive disorder; Vitamin D  deficiency; Hypothyroidism; Hyperlipidemia; Subcutaneous nodule; Supervision of other normal pregnancy, antepartum; Thyroid  disease affecting pregnancy; Obesity during pregnancy; and History of palpitations in adulthood (Metoprolol  prn) on their problem list.  Patient reports no complaints.  Contractions: Not present. Vag. Bleeding: None.  Movement: Present. Denies leaking of fluid.   The following portions of the patient's history were reviewed and updated as appropriate: allergies, current medications, past family history, past medical history, past social history, past surgical history and problem list.   Objective:    Vitals:   02/11/24 0854  BP: 104/71  Pulse: 86  Weight: 196 lb (88.9 kg)    Fetal Status:  Fetal Heart Rate (bpm): 154 Fundal Height: 28 cm Movement: Present    General: Alert, oriented and cooperative. Patient is in no acute distress.  Skin: Skin is warm and dry. No rash noted.   Cardiovascular: Normal heart rate noted  Respiratory: Normal respiratory effort, no problems with respiration noted  Abdomen: Soft, gravid, appropriate for gestational age.  Pain/Pressure: Absent     Pelvic: Cervical exam deferred        Extremities: Normal range of motion.  Edema: None  Mental Status: Normal mood and affect. Normal behavior. Normal judgment and thought content.   Assessment and Plan:  Pregnancy: G2P1001 at [redacted]w[redacted]d 1. Supervision of other normal pregnancy, antepartum (Primary) - on PNV - recheck 2 weeks - tdap updated last week - GTT, CBC, HIV and RPR  2. [redacted] weeks gestation of pregnancy  3. Acquired hypothyroidism - TSH planned today  4. Mixed anxiety and  depressive disorder - on buspar  and sertraline   5. History of palpitations in adulthood (Metoprolol  prn) - has metoprolol  on hand but not need at this at this time  Preterm labor symptoms and general obstetric precautions including but not limited to vaginal bleeding, contractions, leaking of fluid and fetal movement were reviewed in detail with the patient. Please refer to After Visit Summary for other counseling recommendations.   Return in about 2 weeks (around 02/25/2024).  Future Appointments  Date Time Provider Department Center  02/14/2024 12:30 PM Helmus, Jitka, PT [facility] None  02/20/2024  9:30 AM Helmus, Jitka, PT [facility] None  02/25/2024 11:45 AM Helmus, Jitka, PT [facility] None  02/26/2024 10:35 AM Cleotilde Ronal RAMAN, MD [facility]  03/11/2024 10:15 AM Tad, Arland POUR, CNM [facility] [address]  03/19/2024  8:15 AM [facility] PROVIDER 1 [facility]  03/19/2024  8:30 AM [facility] US2 [facility]  04/14/2024 10:15 AM Cleotilde Ronal RAMAN, MD [facility] [address]  04/21/2024 10:55 AM Tad, Arland POUR, CNM [facility] [address]  04/28/2024 10:15 AM Cleotilde Ronal RAMAN, MD [facility] [address]  05/05/2024 10:15 AM Cleotilde Ronal RAMAN, MD [facility] [address]  05/12/2024 10:15 AM Cleotilde Ronal RAMAN, MD [facility] [phone] [facility]    Ronal RAMAN Cleotilde, MD

## 2024-02-12 ENCOUNTER — Ambulatory Visit (HOSPITAL_BASED_OUTPATIENT_CLINIC_OR_DEPARTMENT_OTHER): Payer: Self-pay | Admitting: Obstetrics & Gynecology

## 2024-02-12 DIAGNOSIS — O99013 Anemia complicating pregnancy, third trimester: Secondary | ICD-10-CM

## 2024-02-12 LAB — CBC
Hematocrit: 33.4 % — ABNORMAL LOW (ref 34.0–46.6)
Hemoglobin: 10.5 g/dL — ABNORMAL LOW (ref 11.1–15.9)
MCH: 28.6 pg (ref 26.6–33.0)
MCHC: 31.4 g/dL — ABNORMAL LOW (ref 31.5–35.7)
MCV: 91 fL (ref 79–97)
Platelets: 248 x10E3/uL (ref 150–450)
RBC: 3.67 x10E6/uL — ABNORMAL LOW (ref 3.77–5.28)
RDW: 13.8 % (ref 11.7–15.4)
WBC: 6.7 x10E3/uL (ref 3.4–10.8)

## 2024-02-12 LAB — GLUCOSE TOLERANCE, 2 HOURS W/ 1HR
Glucose, 1 hour: 135 mg/dL (ref 70–179)
Glucose, 2 hour: 110 mg/dL (ref 70–152)
Glucose, Fasting: 73 mg/dL (ref 70–91)

## 2024-02-12 LAB — TSH: TSH: 2.38 u[IU]/mL (ref 0.450–4.500)

## 2024-02-12 LAB — HIV ANTIBODY (ROUTINE TESTING W REFLEX): HIV Screen 4th Generation wRfx: NONREACTIVE

## 2024-02-12 LAB — RPR: RPR Ser Ql: NONREACTIVE

## 2024-02-14 ENCOUNTER — Other Ambulatory Visit (HOSPITAL_BASED_OUTPATIENT_CLINIC_OR_DEPARTMENT_OTHER): Payer: Self-pay | Admitting: Family Medicine

## 2024-02-14 ENCOUNTER — Ambulatory Visit: Admitting: Physical Therapy

## 2024-02-14 DIAGNOSIS — F419 Anxiety disorder, unspecified: Secondary | ICD-10-CM

## 2024-02-15 ENCOUNTER — Ambulatory Visit: Admitting: Sports Medicine

## 2024-02-19 NOTE — Progress Notes (Unsigned)
 Carol Clark D.CLEMENTEEN [facility] [address] [place] [address] Phone: [phone]   Assessment and Plan:     1. Chronic bilateral low back pain with bilateral sciatica (Primary) 2. [redacted] weeks gestation of pregnancy 3. Somatic dysfunction of lumbar region 4. Somatic dysfunction of pelvic region 5. Somatic dysfunction of sacral region -Chronic with exacerbation, initial sports medicine visit - Years of intermittent, mild low back pain that has acutely worsened over the past 1 month likely flared due to pregnancy - Do not recommend p.o. NSAID, prednisone, muscle relaxer use due to pregnancy - Use Tylenol  500 to 1000 mg tablets 2-3 times a day for day-to-day pain relief - Continue pelvic floor physical therapy and start HEP targeting low back, core, gluteal musculature - Patient elected for initial OMT today.  Tolerated well per note below. - Decision today to treat with OMT was based on Physical Exam   After verbal consent patient was treated with HVLA (high velocity low amplitude), ME (muscle energy), FPR (flex positional release), ST (soft tissue), PC/PD (Pelvic Compression/ Pelvic Decompression) techniques in room, lumbar, and pelvic areas. Patient tolerated the procedure well with improvement in symptoms.  Patient educated on potential side effects of soreness and recommended to rest, hydrate, and use Tylenol  as needed for pain control.  15 additional minutes spent for educating Therapeutic Home Exercise Program.  This included exercises focusing on stretching, strengthening, with focus on eccentric aspects.   Long term goals include an improvement in range of motion, strength, endurance as well as avoiding reinjury. Patient's frequency would include in 1-2 times a day, 3-5 times a week for a duration of 6-12 weeks. Proper technique shown and discussed handout in great detail with ATC.  All questions were discussed and answered.     Pertinent previous records  reviewed include none  Follow Up: 1 week for reevaluation.  Could consider repeat OMT   Subjective:   I, Carol Clark am a scribe for Dr. Leonce.    Chief Complaint: low back pain   HPI:   02/20/2024 Patient is a 28 year old female with low back pain. Patient states that pain affects her sleep mostly (falling asleep). Pain does not wake her up at night. Always had a little bit of low back pain prior to pregnancy but it did not limit her in any way. Tried icy hot and lidocaine  patch. The patch helped a lot.   Duration? About a month  Did you have an Injury to cause this pain? no Taking Medication for pain? tylenol  Numbness or Tingling? tingling Does the pain Radiate? Yes down the legs Altered gait or use? Some times ROM/ impairment of movement? no    Relevant Historical Information: Hypothyroidism  Additional pertinent review of systems negative.  Current Outpatient Medications  Medication Sig Dispense Refill   busPIRone  (BUSPAR ) 5 MG tablet TAKE 1 TABLET BY MOUTH 2 TIMES A DAY 90 tablet 1   levothyroxine  (SYNTHROID ) 50 MCG tablet Take 1 tablet (50 mcg total) by mouth daily. 90 tablet 1   metoprolol  tartrate (LOPRESSOR ) 25 MG tablet Take 0.5 tablets (12.5 mg total) by mouth 2 (two) times daily as needed. (Patient not taking: Reported on 02/06/2024) 60 tablet 3   Prenatal Vit-Fe Fumarate-FA (PRENATAL MULTIVITAMIN) TABS tablet Take 1 tablet by mouth daily at 12 noon.     sertraline  (ZOLOFT ) 100 MG tablet Take 2 tablets (200 mg total) by mouth in the morning. 180 tablet 3   No current facility-administered  medications for this visit.      Objective:     Vitals:   02/20/24 1105  BP: 98/60  Pulse: 87  SpO2: 99%  Weight: 221 lb (100.2 kg)  Height: 5' 5 (1.651 m)      Body mass index is 36.78 kg/m.    Physical Exam:     General: Well-appearing, cooperative, sitting comfortably in no acute distress.   OMT Physical Exam:  ASIS Compression Test: Positive  Right Sacrum: Positive sphinx,  TTP bilateral sacral base Lumbar: TTP paraspinal, L2 RLSL Pelvis: Right anterior innominate  Electronically signed by:  Odis Mace D.CLEMENTEEN [facility] 11:29 AM 02/20/24

## 2024-02-20 ENCOUNTER — Ambulatory Visit: Admitting: Sports Medicine

## 2024-02-20 ENCOUNTER — Ambulatory Visit: Attending: Certified Nurse Midwife | Admitting: Physical Therapy

## 2024-02-20 ENCOUNTER — Encounter: Payer: Self-pay | Admitting: Physical Therapy

## 2024-02-20 VITALS — BP 98/60 | HR 87 | Ht 65.0 in | Wt 221.0 lb

## 2024-02-20 DIAGNOSIS — M6281 Muscle weakness (generalized): Secondary | ICD-10-CM | POA: Insufficient documentation

## 2024-02-20 DIAGNOSIS — M9904 Segmental and somatic dysfunction of sacral region: Secondary | ICD-10-CM

## 2024-02-20 DIAGNOSIS — R293 Abnormal posture: Secondary | ICD-10-CM | POA: Diagnosis present

## 2024-02-20 DIAGNOSIS — M5441 Lumbago with sciatica, right side: Secondary | ICD-10-CM

## 2024-02-20 DIAGNOSIS — Z3A29 29 weeks gestation of pregnancy: Secondary | ICD-10-CM

## 2024-02-20 DIAGNOSIS — M9903 Segmental and somatic dysfunction of lumbar region: Secondary | ICD-10-CM

## 2024-02-20 DIAGNOSIS — M9905 Segmental and somatic dysfunction of pelvic region: Secondary | ICD-10-CM

## 2024-02-20 DIAGNOSIS — M5442 Lumbago with sciatica, left side: Secondary | ICD-10-CM

## 2024-02-20 DIAGNOSIS — G8929 Other chronic pain: Secondary | ICD-10-CM

## 2024-02-20 NOTE — Patient Instructions (Signed)
 HEP for back core and glutes. - Use Tylenol  500 to 1000 mg tablets 2-3 times a day for day-to-day pain relief Follow up in 1 week.

## 2024-02-20 NOTE — Therapy (Addendum)
 OUTPATIENT PHYSICAL THERAPY FEMALE PELVIC TREATMENT   Patient Name: Carol Clark MRN: [id number] DOB:09-Jun-1995, 28 y.o., female Today's Date: 02/20/2024  END OF SESSION:  PT End of Session - 02/20/24 0928     Visit Number 3    Authorization Type cigna [id number]  no auth req  medical necessity    PT Start Time 0928    PT Stop Time 1010    PT Time Calculation (min) 42 min    Activity Tolerance Patient tolerated treatment well    Behavior During Therapy [facility] for tasks assessed/performed          Past Medical History:  Diagnosis Date   ADHD    Anxiety    Insomnia 11/22/2020   Palpitations 11/22/2020   Status post vacuum-assisted vaginal delivery 04/08/2021   Delivery 04/08/21   Vacuum-assisted vaginal delivery 2022   Past Surgical History:  Procedure Laterality Date   NO PAST SURGERIES     Patient Active Problem List   Diagnosis Date Noted   Supervision of other normal pregnancy, antepartum 10/16/2023   Thyroid  disease affecting pregnancy 10/16/2023   Obesity during pregnancy 10/16/2023   History of palpitations in adulthood (Metoprolol  prn) 10/16/2023   Subcutaneous nodule 01/01/2023   Hypothyroidism 10/05/2022   Hyperlipidemia 10/05/2022   Mixed anxiety and depressive disorder 07/06/2021   Vitamin D  deficiency 07/06/2021   Palpitations 11/22/2020   Attention deficit disorder 01/27/2016    PCP: de [place], Raymond J, MD  REFERRING PROVIDER: Tad Arland POUR, CNM  REFERRING DIAG: [phone] (ICD-10-CM) - Pelvic pain affecting pregnancy in second trimester, antepartum  THERAPY DIAG:  Muscle weakness (generalized)  Abnormal posture  Rationale for Evaluation and Treatment: Rehabilitation  ONSET DATE: 11/2023  SUBJECTIVE:                                                                                                                                                                                           SUBJECTIVE STATEMENT: Patient reports that she is tired from  work. Lots of standing. She is [redacted] weeks pregnant Vaginal pressure if fine, inner part of thighs is tender Has had some sciatic stuff on left side, was worse last week with more bending       Patient reports that she is doing so much better, exhaling on exertion has made a big difference. Walking and side stepping is ok.  She reports that she is tired today, 25 weeks today.    From last session Patient reports that she has pelvic floor area pain, sometimes sharp, stabbing, sometimes dull ache.  More around vaginal labial area.  Second baby 22 weeks today. A girl, she  has a 2 yo at home Standing and not moving is tolerable, walking and side stepping starts hurting   PAIN:  Are you having pain? Yes NPRS scale: 6-7/10 Pain location: Internal, External, and Vaginal  Pain type: aching and sharp Pain description: intermittent   Aggravating factors: side stepping, shifting weight, standing for long periods of time Relieving factors: res  PRECAUTIONS: None  RED FLAGS: None   WEIGHT BEARING RESTRICTIONS: No  FALLS:  Has patient fallen in last 6 months? No  OCCUPATION: pharmacist, lot of standing in one spot  ACTIVITY LEVEL : does not exercise, but is active  PLOF: Independent  PATIENT GOALS: get to the point where she can stand longer at work, has to sit down  PERTINENT HISTORY:  Vaginal delivery 2022 Sexual abuse: No  BOWEL MOVEMENT: no issues  URINATION:  Pain with urination: No Fully empty bladder: Yes:   Stream: Strong Urgency: Yes  Frequency: yes Fluid Intake:  Leakage: Coughing and Sneezing Pads: Yes: 1/ day- got worse during this pregnancy  INTERCOURSE:  Ability to have vaginal penetration No   PREGNANCY: Vaginal deliveries 1 Tearing Yes: 2nd degree Episiotomy No C-section deliveries no Currently pregnant Yes:    PROLAPSE: Pressure and heaviness   OBJECTIVE:  Note: Objective measures were completed at Evaluation unless otherwise  noted.   PATIENT SURVEYS:   PFIQ-7: 117  COGNITION: Overall cognitive status: Within functional limits for tasks assessed     SENSATION: Light touch: Appears intact   FUNCTIONAL TESTS:  Squat: WFL Single leg stance:  Rt: knee valgus, hip shift  Ou:xwzz valgus, hip shift - reproduces symptoms Curl-up test: 1/3  Breath holding strategies   GAIT: Assistive device utilized: None Comments: painful without Serola belt  POSTURE: rounded shoulders, forward head, and increased lumbar lordosis   LUMBARAROM/PROM:  A/PROM A/PROM  Eval (% available)  Flexion 90  Extension 90  Right lateral flexion 75  Left lateral flexion 75  Right rotation 75  Left rotation 75   (Blank rows = not tested)  PALPATION:   General: decreased lateral rib expansion  Pelvic Alignment: even  Abdominal: pregnant                 External Perineal Exam: no dryness noticed                             Internal Pelvic Floor: weak contraction, able to contract/ relax/ bulge, right levator reduced   Patient confirms identification and approves PT to assess internal pelvic floor and treatment Yes Patient confirms identification and approves PT to assess internal pelvic floor and treatment Yes No emotional/communication barriers or cognitive limitation. Patient is motivated to learn. Patient understands and agrees with treatment goals and plan. PT explains patient will be examined in standing, sitting, and lying down to see how their muscles and joints work. When they are ready, they will be asked to remove their underwear so PT can examine their perineum. The patient is also given the option of providing their own chaperone as one is not provided in our facility. The patient also has the right and is explained the right to defer or refuse any part of the evaluation or treatment including the internal exam. With the patient's consent, PT will use one gloved finger to gently assess the muscles of the pelvic  floor, seeing how well it contracts and relaxes and if there is muscle symmetry. After, the patient will get dressed  and PT and patient will discuss exam findings and plan of care. PT and patient discuss plan of care, schedule, attendance policy and HEP activities.      PELVIC MMT:   MMT eval  Vaginal 3/5, 5 sec  Internal Anal Sphincter   External Anal Sphincter   Puborectalis   Diastasis Recti 1 finger  (Blank rows = not tested)        TONE: average  PROLAPSE: Mild anterior vaginal wall laxity in hook lying with bladder descent with cough  TODAY'S TREATMENT:                                                                                                                              DATE:  02/20/2024 Neuro reed- lower trunk rotations 20 reps                      Figure four stretch 20 reps bilateral                      Cat/ cow 20 reps with transverse abdominis breath                       Yoga ball- hip circles 20 reps                                       Hip glides  20 reps                                       Posterior pelvic tilts 20 reps                       Seated hip adduction with ball with transverse abdominis breath 20 reps slow breaths , VC's to  hug the baby Education on perineal massage and constipation                                                                      01/24/2024 Trial of serola belt Neuro reed- 2x10 hip adduction with ball with transverse abdominis breath VC's for chin tuck Bilateral knee fallout 20 reps with toes up black loop Supine march with black loop with toes up Side lying clam 20 reps Supine ball press with transverse abdominis breath 20 reps     01/01/2024    PATIENT EDUCATION/ there acts Education details: Pt was educated on relevant anatomy, exam findings, home exercise program, plan of care, expectations of PT and Serola belt-  tried on, walked around, did transfers  Person educated: Patient Education method:  Explanation, Demonstration, Tactile cues, Verbal cues, and Handouts Education comprehension: verbalized understanding  HOME EXERCISE PROGRAM: Access Code: [email or web address] URL: [email or web address] Date: 01/01/2024 Prepared by: Cori Clark Clowdus  Program Notes exhale on exertion  Exercises - Abdominal Press into [place]  - 1 x daily - 7 x weekly - 2 sets - 10 reps - Seated Abdominal Press into [employer]  - 1 x daily - 7 x weekly - 2 sets - 10 reps - Diaphragmatic Breathing to Reduce Intra-abdominal Pressure: Lifting a Basket  - 1 x daily - 7 x weekly - 2 sets - 10 reps - Diaphragmatic Breathing to Reduce Intra-abdominal Pressure: Sit to Stand  - 1 x daily - 7 x weekly - 2 sets - 10 reps - Pelvic Floor Contractions in Hooklying with Adduction  - 1 x daily - 7 x weekly - 2 sets - 10 reps  ASSESSMENT:  CLINICAL IMPRESSION: Patient was seen today for treatment of SUI and pelvic pain in pregnancy. Leaking bas been intermittent. She was tired today. Patient did well with exercises, manual therapy and education today. We discussed progress and recommended cont consistency with HEP. Treatment session focused on core strengthening to improve SUI. Patient had minimal difficulty with exercises. Patient is progressing well towards goals and will benefit from continued PT to address deficits, reduce pain and improve quality of life.       Last session Patient is a 28 y.o. F who was seen today for physical therapy evaluation and treatment for pelvic girdle pain in pregnancy. Findings notable for bilateral hip and pelvic floor weakness, decreased lumbo pelvic stability, bladder descent with cough, good bilateral hip PROM, patient able to contract pelvic floor better with exhale checked internally. She did well with trial of Serola belt with reduced groin pain and improved ease of movement. Patient is [redacted] weeks pregnant, had tearing with first baby and right side of levator muscles are reduced. 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 She will benefit from PT to address deficits  OBJECTIVE IMPAIRMENTS: decreased activity tolerance, decreased coordination, decreased endurance, decreased mobility, decreased ROM, decreased strength, increased fascial restrictions, increased muscle spasms, impaired flexibility, impaired tone, improper body mechanics, postural dysfunction, and pain.   ACTIVITY LIMITATIONS: continence and toileting  PARTICIPATION LIMITATIONS: occupation  PERSONAL FACTORS: Profession and Time since onset of injury/illness/exacerbation are also affecting patient's functional outcome.   REHAB POTENTIAL: Good  CLINICAL DECISION MAKING: Evolving/moderate complexity  EVALUATION COMPLEXITY: Moderate   GOALS: Goals reviewed with patient? Yes  SHORT TERM GOALS: Target date: 01/29/2024    Pt will be independent with HEP.   Baseline: Goal status: met  2.  Patient will be I with using Serola or another support belt Baseline:  Goal status: met  3.  Patient will be educated on the knack to reduce SUI Baseline:  Goal status: INITIAL  4.  Patient will be able to stand and walk at least 1 hour without increased low back pain or pelvic girdle pain Baseline:  Goal status: met 01/16/24   LONG TERM GOALS: Target date: 07/04/2023  Pt will be independent with advanced HEP.   Baseline:  Goal status: INITIAL  2.  Patient will be able to stand at work as needed without increased pelvic or low back pain Baseline:  Goal status: INITIAL  3.  Patient will soak 0 pads/ day with sneezing and coughing  Baseline: 1 Goal status: INITIAL  4.  Patient will have PFIQ- 7 reduced to 20 points Baseline: 117 Goal status: INITIAL  5.  Patient will demonstrate improved hip strength bilateral to 5/5 Baseline:  Goal status: INITIAL  6.  Patient will be educated on birth  prep positions for labor and I with perineal massage  Baseline:  Goal status: INITIAL            7. Patient will be  I with perineal massage                                      Baseline:  Goal status: INITIAL    PT FREQUENCY: 1-2x/week  PT DURATION: 6 months    PLANNED INTERVENTIONS: 97164- PT Re-evaluation, 97110-Therapeutic exercises, 97530- Therapeutic activity, 97112- Neuromuscular re-education, 97535- Self Care, [id number]- Manual therapy, [phone]- Gait training, [phone]- Aquatic Therapy, [phone]- Electrical stimulation (unattended), [phone]- Traction (mechanical), [id number]- Ionotophoresis 4mg /ml Dexamethasone, 79439 (1-2 muscles), 20561 (3+ muscles)- Dry Needling, Patient/Family education, Balance training, Taping, Joint mobilization, Joint manipulation, Spinal manipulation, Spinal mobilization, Scar mobilization, Vestibular training, Cryotherapy, Moist heat, and Biofeedback  PLAN FOR NEXT SESSION: pressure management exercises for core strengthening, birth prep info, perineal massage education   Francies Inch, PT 02/20/2024, 9:29 AM

## 2024-02-25 ENCOUNTER — Ambulatory Visit: Admitting: Physical Therapy

## 2024-02-25 ENCOUNTER — Encounter: Payer: Self-pay | Admitting: Physical Therapy

## 2024-02-25 ENCOUNTER — Encounter (HOSPITAL_BASED_OUTPATIENT_CLINIC_OR_DEPARTMENT_OTHER): Admitting: Obstetrics & Gynecology

## 2024-02-25 DIAGNOSIS — M6281 Muscle weakness (generalized): Secondary | ICD-10-CM | POA: Diagnosis not present

## 2024-02-25 DIAGNOSIS — R293 Abnormal posture: Secondary | ICD-10-CM

## 2024-02-25 NOTE — Therapy (Addendum)
 OUTPATIENT PHYSICAL THERAPY FEMALE PELVIC TREATMENT   Patient Name: Carol Clark MRN: [id number] DOB:1995-08-14, 28 y.o., female Today's Date: 02/25/2024  END OF SESSION:  PT End of Session - 02/25/24 1144     Visit Number 4    Authorization Type cigna [id number]  no auth req  medical necessity    PT Start Time 1145    PT Stop Time 1230    PT Time Calculation (min) 45 min    Activity Tolerance Patient tolerated treatment well    Behavior During Therapy [facility] for tasks assessed/performed          Past Medical History:  Diagnosis Date   ADHD    Anxiety    Insomnia 11/22/2020   Palpitations 11/22/2020   Status post vacuum-assisted vaginal delivery 04/08/2021   Delivery 04/08/21   Vacuum-assisted vaginal delivery 2022   Past Surgical History:  Procedure Laterality Date   NO PAST SURGERIES     Patient Active Problem List   Diagnosis Date Noted   Supervision of other normal pregnancy, antepartum 10/16/2023   Thyroid  disease affecting pregnancy 10/16/2023   Obesity during pregnancy 10/16/2023   History of palpitations in adulthood (Metoprolol  prn) 10/16/2023   Subcutaneous nodule 01/01/2023   Hypothyroidism 10/05/2022   Hyperlipidemia 10/05/2022   Mixed anxiety and depressive disorder 07/06/2021   Vitamin D  deficiency 07/06/2021   Palpitations 11/22/2020   Attention deficit disorder 01/27/2016    PCP: de [place], Raymond J, MD  REFERRING PROVIDER: Tad Arland POUR, CNM  REFERRING DIAG: [phone] (ICD-10-CM) - Pelvic pain affecting pregnancy in second trimester, antepartum  THERAPY DIAG:  Muscle weakness (generalized)  Abnormal posture  Rationale for Evaluation and Treatment: Rehabilitation  ONSET DATE: 11/2023  SUBJECTIVE:                                                                                                                                                                                           SUBJECTIVE STATEMENT: Patient is [redacted] weeks pregnant Patient  reports that she felt better after last visit Less pain Tired from standing at work all weekend She is interested in postpartum PT 9 had vacuum assisted birth last visit and recovery was horrible) Has not been able to her exercise Keeps forgetting about perineal massage, needs to do it No pain today 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 Lots of standing. She is [redacted] weeks pregnant Vaginal pressure if fine, inner part of thighs is tender Has had some sciatic stuff on  left side, was worse last week with more bending   PAIN:  Are you having pain? Yes NPRS scale: 6-7/10 Pain location: Internal, External, and Vaginal  Pain type: aching and sharp Pain description: intermittent   Aggravating factors: side stepping, shifting weight, standing for long periods of time Relieving factors: res  PRECAUTIONS: None  RED FLAGS: None   WEIGHT BEARING RESTRICTIONS: No  FALLS:  Has patient fallen in last 6 months? No  OCCUPATION: pharmacist, lot of standing in one spot  ACTIVITY LEVEL : does not exercise, but is active  PLOF: Independent  PATIENT GOALS: get to the point where she can stand longer at work, has to sit down  PERTINENT HISTORY:  Vaginal delivery 2022 Sexual abuse: No  BOWEL MOVEMENT: no issues  URINATION:  Pain with urination: No Fully empty bladder: Yes:   Stream: Strong Urgency: Yes  Frequency: yes Fluid Intake:  Leakage: Coughing and Sneezing Pads: Yes: 1/ day- got worse during this pregnancy  INTERCOURSE:  Ability to have vaginal penetration No   PREGNANCY: Vaginal deliveries 1 Tearing Yes: 2nd degree Episiotomy No C-section deliveries no Currently pregnant Yes:    PROLAPSE: Pressure and heaviness   OBJECTIVE:  Note: Objective measures were completed at Evaluation unless otherwise noted.   PATIENT SURVEYS:   PFIQ-7: 117  COGNITION: Overall cognitive  status: Within functional limits for tasks assessed     SENSATION: Light touch: Appears intact   FUNCTIONAL TESTS:  Squat: WFL Single leg stance:  Rt: knee valgus, hip shift  Ou:xwzz valgus, hip shift - reproduces symptoms Curl-up test: 1/3  Breath holding strategies   GAIT: Assistive device utilized: None Comments: painful without Serola belt  POSTURE: rounded shoulders, forward head, and increased lumbar lordosis   LUMBARAROM/PROM:  A/PROM A/PROM  Eval (% available)  Flexion 90  Extension 90  Right lateral flexion 75  Left lateral flexion 75  Right rotation 75  Left rotation 75   (Blank rows = not tested)  PALPATION:   General: decreased lateral rib expansion  Pelvic Alignment: even  Abdominal: pregnant                 External Perineal Exam: no dryness noticed                             Internal Pelvic Floor: weak contraction, able to contract/ relax/ bulge, right levator reduced   Patient confirms identification and approves PT to assess internal pelvic floor and treatment Yes Patient confirms identification and approves PT to assess internal pelvic floor and treatment Yes No emotional/communication barriers or cognitive limitation. Patient is motivated to learn. Patient understands and agrees with treatment goals and plan. PT explains patient will be examined in standing, sitting, and lying down to see how their muscles and joints work. When they are ready, they will be asked to remove their underwear so PT can examine their perineum. The patient is also given the option of providing their own chaperone as one is not provided in our facility. The patient also has the right and is explained the right to defer or refuse any part of the evaluation or treatment including the internal exam. With the patient's consent, PT will use one gloved finger to gently assess the muscles of the pelvic floor, seeing how well it contracts and relaxes and if there is muscle symmetry.  After, the patient will get dressed and PT and patient will discuss exam findings and  plan of care. PT and patient discuss plan of care, schedule, attendance policy and HEP activities.      PELVIC MMT:   MMT eval  Vaginal 3/5, 5 sec  Internal Anal Sphincter   External Anal Sphincter   Puborectalis   Diastasis Recti 1 finger  (Blank rows = not tested)        TONE: average  PROLAPSE: Mild anterior vaginal wall laxity in hook lying with bladder descent with cough  TODAY'S TREATMENT:                                                                                                                              DATE:  02/25/2024 Neuro reed- Cat/ cow 20 reps with transverse abdominis breath Internal hip rotation stretch 20 reps ( birth prep) Reverse clamshell 20 reps bilateral Education on the knack with sneezing and coughing  Open books with diaphragmatic breathing 20 reps with foam roller Seated hip adduction with ball with green thera band stretch 20 reps    02/20/2024 Neuro reed- lower trunk rotations 20 reps                      Figure four stretch 20 reps bilateral                      Cat/ cow 20 reps with transverse abdominis breath                       Yoga ball- hip circles 20 reps                                       Hip glides  20 reps                                       Posterior pelvic tilts 20 reps                       Seated hip adduction with ball with transverse abdominis breath 20 reps slow breaths , VC's to  hug the baby Education on perineal massage and constipation                                                                      01/24/2024 Trial of serola belt Neuro reed- 2x10 hip adduction with ball with transverse abdominis breath VC's for chin tuck Bilateral knee fallout 20 reps with toes up black loop Supine march with black loop with  toes up Side lying clam 20 reps Supine ball press with transverse abdominis breath 20  reps     01/01/2024 PATIENT EDUCATION/ there acts Education details: Pt was educated on relevant anatomy, exam findings, home exercise program, plan of care, expectations of PT and Serola belt- tried on, walked around, did transfers  Person educated: Patient Education method: Explanation, Demonstration, Tactile cues, Verbal cues, and Handouts Education comprehension: verbalized understanding  HOME EXERCISE PROGRAM: Access Code: [email or web address] URL: [email or web address] Date: 01/01/2024 Prepared by: Cori Medora Roorda  Program Notes exhale on exertion  Exercises - Abdominal Press into [place]  - 1 x daily - 7 x weekly - 2 sets - 10 reps - Seated Abdominal Press into [employer]  - 1 x daily - 7 x weekly - 2 sets - 10 reps - Diaphragmatic Breathing to Reduce Intra-abdominal Pressure: Lifting a Basket  - 1 x daily - 7 x weekly - 2 sets - 10 reps - Diaphragmatic Breathing to Reduce Intra-abdominal Pressure: Sit to Stand  - 1 x daily - 7 x weekly - 2 sets - 10 reps - Pelvic Floor Contractions in Hooklying with Adduction  - 1 x daily - 7 x weekly - 2 sets - 10 reps  ASSESSMENT:  CLINICAL IMPRESSION: Patient was seen today for treatment of SUI and pelvic pain in pregnancy. Patient with good tolerance to exercises and no pain today. Patient did well with exercises, and education today. We discussed progress, the knack and recommended cont HEP as able. Treatment session focused on exercises to prepare for birth ( patient had vacuum assisted birth first pregnancy. Patient had no difficulty with exercises. Patient is progressing well towards goals and will benefit from continued PT to address deficits, reduce pain and SUI and improve quality of life.          Last session Patient is a 28 y.o. F who was seen today for physical therapy evaluation and treatment for pelvic girdle pain in pregnancy. Findings notable for bilateral hip and pelvic floor weakness, decreased lumbo pelvic stability,  bladder descent with cough, good bilateral hip PROM, patient able to contract pelvic floor better with exhale checked internally. She did well with trial of Serola belt with reduced groin pain and improved ease of movement. Patient is [redacted] weeks pregnant, had tearing with first baby and right side of levator muscles are reduced. 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 She will benefit from PT to address deficits  OBJECTIVE IMPAIRMENTS: decreased activity tolerance, decreased coordination, decreased endurance, decreased mobility, decreased ROM, decreased strength, increased fascial restrictions, increased muscle spasms, impaired flexibility, impaired tone, improper body mechanics, postural dysfunction, and pain.   ACTIVITY LIMITATIONS: continence and toileting  PARTICIPATION LIMITATIONS: occupation  PERSONAL FACTORS: Profession and Time since onset of injury/illness/exacerbation are also affecting patient's functional outcome.   REHAB POTENTIAL: Good  CLINICAL DECISION MAKING: Evolving/moderate complexity  EVALUATION COMPLEXITY: Moderate   GOALS: Goals reviewed with patient? Yes  SHORT TERM GOALS: Target date: 01/29/2024    Pt will be independent with HEP.   Baseline: Goal status: met  2.  Patient will be I with using Serola or another support belt Baseline:  Goal status: met  3.  Patient will be educated on the knack to reduce SUI Baseline:  Goal status: INITIAL  4.  Patient will be able to stand and walk at least 1 hour without increased low  back pain or pelvic girdle pain Baseline:  Goal status: met 01/16/24   LONG TERM GOALS: Target date: 07/04/2023  Pt will be independent with advanced HEP.   Baseline:  Goal status: INITIAL  2.  Patient will be able to stand at work as needed without increased pelvic or low back pain Baseline:  Goal status:  INITIAL  3.  Patient will soak 0 pads/ day with sneezing and coughing  Baseline: 1 Goal status: INITIAL  4.  Patient will have PFIQ- 7 reduced to 20 points Baseline: 117 Goal status: INITIAL  5.  Patient will demonstrate improved hip strength bilateral to 5/5 Baseline:  Goal status: INITIAL  6.  Patient will be educated on birth prep positions for labor and I with perineal massage  Baseline:  Goal status: INITIAL            7. Patient will be  I with perineal massage                                      Baseline:  Goal status: INITIAL    PT FREQUENCY: 1-2x/week  PT DURATION: 6 months    PLANNED INTERVENTIONS: 97164- PT Re-evaluation, 97110-Therapeutic exercises, 97530- Therapeutic activity, 97112- Neuromuscular re-education, 97535- Self Care, 02859- Manual therapy, [phone]- Gait training, [phone]- Aquatic Therapy, [phone]- Electrical stimulation (unattended), [phone]- Traction (mechanical), [id number]- Ionotophoresis 4mg /ml Dexamethasone, 79439 (1-2 muscles), 20561 (3+ muscles)- Dry Needling, Patient/Family education, Balance training, Taping, Joint mobilization, Joint manipulation, Spinal manipulation, Spinal mobilization, Scar mobilization, Vestibular training, Cryotherapy, Moist heat, and Biofeedback  PLAN FOR NEXT SESSION: pressure management exercises for core strengthening, birth prep info, perineal massage education   Eunice Oldaker, PT 02/25/2024, 11:45 AM

## 2024-02-26 ENCOUNTER — Ambulatory Visit (HOSPITAL_BASED_OUTPATIENT_CLINIC_OR_DEPARTMENT_OTHER): Admitting: Obstetrics & Gynecology

## 2024-02-26 VITALS — BP 102/69 | HR 95 | Wt 217.8 lb

## 2024-02-26 DIAGNOSIS — Z8679 Personal history of other diseases of the circulatory system: Secondary | ICD-10-CM

## 2024-02-26 DIAGNOSIS — E039 Hypothyroidism, unspecified: Secondary | ICD-10-CM

## 2024-02-26 DIAGNOSIS — D508 Other iron deficiency anemias: Secondary | ICD-10-CM

## 2024-02-26 DIAGNOSIS — F418 Other specified anxiety disorders: Secondary | ICD-10-CM

## 2024-02-26 DIAGNOSIS — Z3480 Encounter for supervision of other normal pregnancy, unspecified trimester: Secondary | ICD-10-CM

## 2024-02-26 DIAGNOSIS — Z3A29 29 weeks gestation of pregnancy: Secondary | ICD-10-CM

## 2024-02-26 DIAGNOSIS — D649 Anemia, unspecified: Secondary | ICD-10-CM | POA: Insufficient documentation

## 2024-02-26 MED ORDER — IRON-VITAMIN C 65-125 MG PO TABS: 1.0000 | ORAL_TABLET | Freq: Three times a day (TID) | ORAL | Status: AC

## 2024-02-26 NOTE — Progress Notes (Signed)
   PRENATAL VISIT NOTE  Subjective:  Carol Clark is a 28 y.o. G2P1001 at [redacted]w[redacted]d being seen today for ongoing prenatal care.  She is currently monitored for the following issues for this low-risk pregnancy and has Attention deficit disorder; Palpitations; Mixed anxiety and depressive disorder; Vitamin D  deficiency; Hypothyroidism; Hyperlipidemia; Subcutaneous nodule; Supervision of other normal pregnancy, antepartum; Thyroid  disease affecting pregnancy; Obesity during pregnancy; History of palpitations in adulthood (Metoprolol  prn); and Absolute anemia on their problem list.  Patient reports no complaints.  Contractions: Not present. Vag. Bleeding: None.  Movement: Present. Denies leaking of fluid.   The following portions of the patient's history were reviewed and updated as appropriate: allergies, current medications, past family history, past medical history, past social history, past surgical history and problem list.   Objective:    Vitals:   02/26/24 1033  BP: 102/69  Pulse: 95  Weight: 217 lb 12.8 oz (98.8 kg)    Fetal Status:  Fetal Heart Rate (bpm): 137 Fundal Height: 31 cm Movement: Present    General: Alert, oriented and cooperative. Patient is in no acute distress.  Skin: Skin is warm and dry. No rash noted.   Cardiovascular: Normal heart rate noted  Respiratory: Normal respiratory effort, no problems with respiration noted  Abdomen: Soft, gravid, appropriate for gestational age.  Pain/Pressure: Present (upper abdominal right side pain)     Pelvic: Cervical exam deferred        Extremities: Normal range of motion.  Edema: None  Mental Status: Normal mood and affect. Normal behavior. Normal judgment and thought content.   Assessment and Plan:  Pregnancy: G2P1001 at [redacted]w[redacted]d 1. Supervision of other normal pregnancy, antepartum (Primary) - on PNV - rechekc 2 weeks  2. Acquired hypothyroidism - TSH last done 02/11/2024 - growth scan scheduled around   3. [redacted] weeks  gestation of pregnancy  4. Other iron deficiency anemia - has started iron - Iron-Vitamin C 65-125 MG TABS; Take 1 tablet by mouth 3 (three) times daily.  5. Mixed anxiety and depressive disorder - on buspar  and sertraline   6. History of palpitations in adulthood (Metoprolol  prn) - metroprolol prn.  Has rx on hand.  Preterm labor symptoms and general obstetric precautions including but not limited to vaginal bleeding, contractions, leaking of fluid and fetal movement were reviewed in detail with the patient. Please refer to After Visit Summary for other counseling recommendations.   Return in about 2 weeks (around 03/11/2024).  Future Appointments  Date Time Provider Department Center  02/28/2024  2:15 PM Leonce Katz, DO [facility] None  03/11/2024 10:35 AM Cleotilde Ronal RAMAN, MD [facility] [id number]  03/19/2024  8:15 AM [facility] PROVIDER 1 [facility]  03/19/2024  8:30 AM [facility] US2 [facility]  04/14/2024  9:55 AM Lo, Arland POUR, CNM [facility] [address]  04/21/2024 10:55 AM Lo, Arland POUR, CNM [facility] [id number]  04/28/2024 10:15 AM Cleotilde Ronal RAMAN, MD [facility] [address]  05/05/2024  9:55 AM Tad, Arland POUR, CNM [facility] [id number]  05/12/2024 10:15 AM Cleotilde Ronal RAMAN, MD [facility] [phone] [address]    Ronal RAMAN Cleotilde, MD

## 2024-02-27 NOTE — Progress Notes (Unsigned)
 [name] [address] [place] [address] Phone: [phone]   Assessment and Plan:     1. Chronic bilateral low back pain with bilateral sciatica (Primary) 2.  [redacted] weeks gestation of pregnancy 3. Somatic dysfunction of lumbar region 4. Somatic dysfunction of pelvic region 5. Somatic dysfunction of sacral region -Chronic with exacerbation, subsequent visit - Continued low back pain with overall improvement with HEP and OMT.  Consistent with musculoskeletal dysfunction flared by pregnancy.  Patient does have intermittent history of chronic low back pain which could be further explored after delivery if pain remains - Do not recommend p.o. NSAIDs, prednisone, muscle relaxers due to pregnancy - Use Tylenol  500 to 1000 mg tablets 2-3 times a day for day-to-day pain relief - Continue pelvic floor physical therapy and HEP - Patient has received relief with OMT in the past.  Elects for repeat OMT today.  Tolerated well per note below. - Decision today to treat with OMT was based on Physical Exam   After verbal consent patient was treated with HVLA (high velocity low amplitude), ME (muscle energy), FPR (flex positional release), ST (soft tissue), PC/PD (Pelvic Compression/ Pelvic Decompression) techniques in sacrum, lumbar, and pelvic areas. Patient tolerated the procedure well with improvement in symptoms.  Patient educated on potential side effects of soreness and recommended to rest, hydrate, and use Tylenol  as needed for pain control.   Pertinent previous records reviewed include none  Follow Up: 3 to 4 weeks for reevaluation.  Could consider repeat OMT   Subjective:   I, Georgina Krist, am serving as a Neurosurgeon for Doctor Morene Mace  Chief Complaint: low back pain    HPI:    02/20/2024 Patient is a 28 year old female with low back pain. Patient states that pain affects her sleep mostly (falling asleep). Pain does not wake her up at  night. Always had a little bit of low back pain prior to pregnancy but it did not limit her in any way. Tried icy hot and lidocaine  patch. The patch helped a lot.    Duration? About a month  Did you have an Injury to cause this pain? no Taking Medication for pain? tylenol  Numbness or Tingling? tingling Does the pain Radiate? Yes down the legs Altered gait or use? Some times ROM/ impairment of movement? no   02/28/2024 Patient states she is doing good. Pain is now a restless feeling.    Relevant Historical Information: Hypothyroidism  Additional pertinent review of systems negative.  Current Outpatient Medications  Medication Sig Dispense Refill   busPIRone  (BUSPAR ) 5 MG tablet TAKE 1 TABLET BY MOUTH 2 TIMES A DAY 90 tablet 1   Iron-Vitamin C 65-125 MG TABS Take 1 tablet by mouth 3 (three) times daily.     levothyroxine  (SYNTHROID ) 50 MCG tablet Take 1 tablet (50 mcg total) by mouth daily. 90 tablet 1   metoprolol  tartrate (LOPRESSOR ) 25 MG tablet Take 0.5 tablets (12.5 mg total) by mouth 2 (two) times daily as needed. (Patient not taking: Reported on 02/06/2024) 60 tablet 3   Prenatal Vit-Fe Fumarate-FA (PRENATAL MULTIVITAMIN) TABS tablet Take 1 tablet by mouth daily at 12 noon.     sertraline  (ZOLOFT ) 100 MG tablet Take 2 tablets (200 mg total) by mouth in the morning. 180 tablet 3   No current facility-administered medications for this visit.      Objective:     Vitals:   02/28/24 1400  BP: 120/80  Pulse: ROLLEN)  103  SpO2: 98%  Weight: 222 lb (100.7 kg)  Height: 5' 5 (1.651 m)      Body mass index is 36.94 kg/m.    Physical Exam:     General: Well-appearing, cooperative, sitting comfortably in no acute distress.   OMT Physical Exam:  ASIS Compression Test: Positive Right Sacrum: Positive sphinx, TTP bilateral sacral base Lumbar: TTP paraspinal, L1-3 RRSL Pelvis: Right anterior innominate  Electronically signed by:  Odis Mace D.[facility] 2:12 PM 02/28/24

## 2024-02-28 ENCOUNTER — Ambulatory Visit: Admitting: Sports Medicine

## 2024-02-28 VITALS — BP 120/80 | HR 103 | Ht 65.0 in | Wt 222.0 lb

## 2024-02-28 DIAGNOSIS — M9905 Segmental and somatic dysfunction of pelvic region: Secondary | ICD-10-CM | POA: Diagnosis not present

## 2024-02-28 DIAGNOSIS — M5442 Lumbago with sciatica, left side: Secondary | ICD-10-CM

## 2024-02-28 DIAGNOSIS — M9904 Segmental and somatic dysfunction of sacral region: Secondary | ICD-10-CM

## 2024-02-28 DIAGNOSIS — M5441 Lumbago with sciatica, right side: Secondary | ICD-10-CM

## 2024-02-28 DIAGNOSIS — Z3A30 30 weeks gestation of pregnancy: Secondary | ICD-10-CM | POA: Diagnosis not present

## 2024-02-28 DIAGNOSIS — M9903 Segmental and somatic dysfunction of lumbar region: Secondary | ICD-10-CM | POA: Diagnosis not present

## 2024-02-28 DIAGNOSIS — G8929 Other chronic pain: Secondary | ICD-10-CM

## 2024-02-29 ENCOUNTER — Ambulatory Visit: Admitting: Sports Medicine

## 2024-03-06 ENCOUNTER — Encounter: Admitting: Physical Therapy

## 2024-03-10 ENCOUNTER — Encounter (HOSPITAL_BASED_OUTPATIENT_CLINIC_OR_DEPARTMENT_OTHER): Admitting: Obstetrics & Gynecology

## 2024-03-11 ENCOUNTER — Encounter (HOSPITAL_BASED_OUTPATIENT_CLINIC_OR_DEPARTMENT_OTHER): Payer: Self-pay | Admitting: Obstetrics & Gynecology

## 2024-03-11 ENCOUNTER — Ambulatory Visit (INDEPENDENT_AMBULATORY_CARE_PROVIDER_SITE_OTHER): Admitting: Obstetrics & Gynecology

## 2024-03-11 VITALS — BP 116/75 | HR 95 | Wt 222.6 lb

## 2024-03-11 DIAGNOSIS — Z3A31 31 weeks gestation of pregnancy: Secondary | ICD-10-CM

## 2024-03-11 DIAGNOSIS — O99283 Endocrine, nutritional and metabolic diseases complicating pregnancy, third trimester: Secondary | ICD-10-CM

## 2024-03-11 DIAGNOSIS — O99343 Other mental disorders complicating pregnancy, third trimester: Secondary | ICD-10-CM

## 2024-03-11 DIAGNOSIS — O99013 Anemia complicating pregnancy, third trimester: Secondary | ICD-10-CM | POA: Insufficient documentation

## 2024-03-11 DIAGNOSIS — F418 Other specified anxiety disorders: Secondary | ICD-10-CM

## 2024-03-11 DIAGNOSIS — E039 Hypothyroidism, unspecified: Secondary | ICD-10-CM

## 2024-03-11 DIAGNOSIS — E079 Disorder of thyroid, unspecified: Secondary | ICD-10-CM

## 2024-03-11 DIAGNOSIS — Z8679 Personal history of other diseases of the circulatory system: Secondary | ICD-10-CM

## 2024-03-11 DIAGNOSIS — D649 Anemia, unspecified: Secondary | ICD-10-CM

## 2024-03-11 DIAGNOSIS — Z3480 Encounter for supervision of other normal pregnancy, unspecified trimester: Secondary | ICD-10-CM

## 2024-03-11 NOTE — Progress Notes (Signed)
   PRENATAL VISIT NOTE  Subjective:  Carol Clark is a 28 y.o. G2P1001 at [redacted]w[redacted]d being seen today for ongoing prenatal care.  She is currently monitored for the following issues for this low-risk pregnancy and has Attention deficit disorder; Palpitations; Mixed anxiety and depressive disorder; Vitamin D  deficiency; Hypothyroidism; Hyperlipidemia; Subcutaneous nodule; Supervision of other normal pregnancy, antepartum; Thyroid  disease affecting pregnancy; Obesity during pregnancy; History of palpitations in adulthood (Metoprolol  prn); and Absolute anemia on their problem list.  Patient reports no complaints.  New job is going well.  Contractions: Not present. Vag. Bleeding: None.  Movement: Present. Denies leaking of fluid.   The following portions of the patient's history were reviewed and updated as appropriate: allergies, current medications, past family history, past medical history, past social history, past surgical history and problem list.   Objective:   Vitals:   03/11/24 1028  BP: 116/75  Pulse: 95  Weight: 222 lb 9.6 oz (101 kg)    Fetal Status:  Fetal Heart Rate (bpm): 147   Movement: Present    General: Alert, oriented and cooperative. Patient is in no acute distress.  Skin: Skin is warm and dry. No rash noted.   Cardiovascular: Normal heart rate noted  Respiratory: Normal respiratory effort, no problems with respiration noted  Abdomen: Soft, gravid, appropriate for gestational age.  Pain/Pressure: Absent     Pelvic: Cervical exam deferred        Extremities: Normal range of motion.  Edema: None  Mental Status: Normal mood and affect. Normal behavior. Normal judgment and thought content.    Assessment and Plan:  Pregnancy: G2P1001 at [redacted]w[redacted]d 1. Supervision of other normal pregnancy, antepartum (Primary) - on PNV - recheck 2 weeks  2. [redacted] weeks gestation of pregnancy  3. Acquired hypothyroidism - recheck TSH around 36 weeks - has growth scan scheduled with MFM on  11/12  4. History of palpitations in adulthood (Metoprolol  prn) - has metoprolol  on hand but not needing in pregnancy   5. Mixed anxiety and depressive disorder - on Sertraline  100mg  daily.    6. Anemia during pregnancy in third trimester - taking iron about every other day  Preterm labor symptoms and general obstetric precautions including but not limited to vaginal bleeding, contractions, leaking of fluid and fetal movement were reviewed in detail with the patient. Please refer to After Visit Summary for other counseling recommendations.   Return in about 1 week (around 03/18/2024).  Future Appointments  Date Time Provider Department Center  03/19/2024  8:15 AM [facility] PROVIDER 1 [facility]  03/19/2024  8:30 AM [facility] US2 [facility]  03/20/2024  2:00 PM Leonce Katz, DO [facility] None  04/14/2024  9:55 AM Lo, Arland POUR, CNM [facility] [id number]  04/21/2024 10:55 AM Lo, Arland POUR, CNM [facility] [address]  04/28/2024 10:15 AM Cleotilde Ronal RAMAN, MD [facility] [address]  05/05/2024  9:55 AM Lo, Arland POUR, CNM [facility] [phone] [address]    Ronal RAMAN Cleotilde, MD

## 2024-03-12 ENCOUNTER — Other Ambulatory Visit (HOSPITAL_BASED_OUTPATIENT_CLINIC_OR_DEPARTMENT_OTHER): Payer: Self-pay

## 2024-03-12 MED ORDER — ABRYSVO 120 MCG/0.5ML IM SOLR
0.5000 mL | Freq: Once | INTRAMUSCULAR | 0 refills | Status: AC
  Filled 2024-03-19 – 2024-03-26 (×4): qty 0.5, 1d supply, fill #0

## 2024-03-13 ENCOUNTER — Encounter (HOSPITAL_BASED_OUTPATIENT_CLINIC_OR_DEPARTMENT_OTHER): Payer: Self-pay | Admitting: Obstetrics & Gynecology

## 2024-03-14 ENCOUNTER — Other Ambulatory Visit (HOSPITAL_BASED_OUTPATIENT_CLINIC_OR_DEPARTMENT_OTHER): Payer: Self-pay

## 2024-03-16 ENCOUNTER — Encounter (HOSPITAL_BASED_OUTPATIENT_CLINIC_OR_DEPARTMENT_OTHER): Payer: Self-pay | Admitting: Obstetrics & Gynecology

## 2024-03-17 ENCOUNTER — Encounter (HOSPITAL_BASED_OUTPATIENT_CLINIC_OR_DEPARTMENT_OTHER): Payer: Self-pay

## 2024-03-19 ENCOUNTER — Other Ambulatory Visit (HOSPITAL_BASED_OUTPATIENT_CLINIC_OR_DEPARTMENT_OTHER): Payer: Self-pay

## 2024-03-19 ENCOUNTER — Ambulatory Visit (HOSPITAL_BASED_OUTPATIENT_CLINIC_OR_DEPARTMENT_OTHER)

## 2024-03-19 ENCOUNTER — Ambulatory Visit: Attending: Maternal & Fetal Medicine | Admitting: Maternal & Fetal Medicine

## 2024-03-19 ENCOUNTER — Other Ambulatory Visit: Payer: Self-pay | Admitting: *Deleted

## 2024-03-19 VITALS — BP 97/64 | HR 91

## 2024-03-19 DIAGNOSIS — Z3A33 33 weeks gestation of pregnancy: Secondary | ICD-10-CM | POA: Diagnosis not present

## 2024-03-19 DIAGNOSIS — O99283 Endocrine, nutritional and metabolic diseases complicating pregnancy, third trimester: Secondary | ICD-10-CM | POA: Insufficient documentation

## 2024-03-19 DIAGNOSIS — Z362 Encounter for other antenatal screening follow-up: Secondary | ICD-10-CM | POA: Insufficient documentation

## 2024-03-19 DIAGNOSIS — O26893 Other specified pregnancy related conditions, third trimester: Secondary | ICD-10-CM | POA: Diagnosis not present

## 2024-03-19 DIAGNOSIS — O99213 Obesity complicating pregnancy, third trimester: Secondary | ICD-10-CM | POA: Diagnosis not present

## 2024-03-19 DIAGNOSIS — E063 Autoimmune thyroiditis: Secondary | ICD-10-CM

## 2024-03-19 DIAGNOSIS — E079 Disorder of thyroid, unspecified: Secondary | ICD-10-CM

## 2024-03-19 DIAGNOSIS — R002 Palpitations: Secondary | ICD-10-CM

## 2024-03-19 DIAGNOSIS — O99280 Endocrine, nutritional and metabolic diseases complicating pregnancy, unspecified trimester: Secondary | ICD-10-CM

## 2024-03-19 DIAGNOSIS — E039 Hypothyroidism, unspecified: Secondary | ICD-10-CM

## 2024-03-19 DIAGNOSIS — O99413 Diseases of the circulatory system complicating pregnancy, third trimester: Secondary | ICD-10-CM | POA: Diagnosis not present

## 2024-03-19 DIAGNOSIS — O99210 Obesity complicating pregnancy, unspecified trimester: Secondary | ICD-10-CM

## 2024-03-19 DIAGNOSIS — E669 Obesity, unspecified: Secondary | ICD-10-CM

## 2024-03-19 NOTE — Progress Notes (Signed)
   Patient information  Patient Name: Carol Clark  Patient MRN:   [id number]  Referring practice: MFM Referring Provider: [facility]  Problem List   Patient Active Problem List   Diagnosis Date Noted   Anemia during pregnancy in third trimester 03/11/2024   Supervision of other normal pregnancy, antepartum 10/16/2023   Thyroid  disease affecting pregnancy 10/16/2023   Obesity during pregnancy 10/16/2023   History of palpitations in adulthood (Metoprolol  prn) 10/16/2023   Hypothyroidism 10/05/2022   Hyperlipidemia 10/05/2022   Mixed anxiety and depressive disorder 07/06/2021   Vitamin D  deficiency 07/06/2021   Palpitations 11/22/2020   Attention deficit disorder 01/27/2016    Maternal Fetal medicine Consult  [facility] MARKHAM is a 28 y.o. G2P1001 at [redacted]w[redacted]d here for ultrasound and consultation. [facility] is doing well today with no acute concerns. Today we focused on the following:   The patient's hypothyroidism is well-controlled with minimal dose adjustments.  She has a history of palpitations but has not required to take her beta-blocker this pregnancy.  For this reason antenatal testing is not indicated but serial growth ultrasounds to be done throughout the pregnancy.  Show formal growth ultrasound at 37 weeks.  She reports good fetal movement has no other concerns at this time.  The patient had time to ask questions that were answered to her satisfaction.  She verbalized understanding and agrees to proceed with the plan below.  Recommendations - Continue Synthroid , repeat labs every trimester and then 4 to 6 weeks postpartum.  TSH goal of less than 2.5.  Review of Systems: A review of systems was performed and was negative except per HPI   Vitals and Physical Exam    03/19/2024    8:27 AM 03/11/2024   10:28 AM 02/28/2024    2:00 PM  Vitals with BMI  Height   5' 5  Weight  222 lbs 10 oz 222 lbs  BMI  37.04 36.94  Systolic 97 116 120  Diastolic 64 75 80   Pulse 91 95 103    Sitting comfortably on the sonogram table Nonlabored breathing Normal rate and rhythm Abdomen is nontender  Past pregnancies OB History  Gravida Para Term Preterm AB Living  2 1 1  0 0 1  SAB IAB Ectopic Multiple Live Births  0 0 0 0 1    # Outcome Date GA Lbr Len/2nd Weight Sex Type Anes PTL Lv  2 Current           1 Term 04/08/21 [redacted]w[redacted]d 28:32 / 01:13 8 lb 2.5 oz (3.7 kg) F Vag-Vacuum EPI  LIV     Complications: Chorioamnionitis    I spent 30 minutes reviewing the patients chart, including labs and images as well as counseling the patient about her medical conditions. Greater than 50% of the time was spent in direct face-to-face patient counseling.  Delora Smaller  MFM, [facility]   03/19/2024  12:05 PM

## 2024-03-19 NOTE — Assessment & Plan Note (Signed)
-   Continue Synthroid , repeat labs every trimester and then 4 to 6 weeks postpartum.  TSH goal of less than 2.5.

## 2024-03-20 ENCOUNTER — Ambulatory Visit: Admitting: Sports Medicine

## 2024-03-20 ENCOUNTER — Encounter (HOSPITAL_BASED_OUTPATIENT_CLINIC_OR_DEPARTMENT_OTHER): Payer: Self-pay | Admitting: Obstetrics and Gynecology

## 2024-03-26 ENCOUNTER — Other Ambulatory Visit (HOSPITAL_BASED_OUTPATIENT_CLINIC_OR_DEPARTMENT_OTHER): Payer: Self-pay

## 2024-03-27 ENCOUNTER — Other Ambulatory Visit (HOSPITAL_BASED_OUTPATIENT_CLINIC_OR_DEPARTMENT_OTHER): Payer: Self-pay

## 2024-03-27 ENCOUNTER — Other Ambulatory Visit (HOSPITAL_COMMUNITY): Payer: Self-pay

## 2024-03-28 ENCOUNTER — Other Ambulatory Visit (HOSPITAL_BASED_OUTPATIENT_CLINIC_OR_DEPARTMENT_OTHER): Payer: Self-pay

## 2024-03-28 ENCOUNTER — Ambulatory Visit (HOSPITAL_BASED_OUTPATIENT_CLINIC_OR_DEPARTMENT_OTHER): Admitting: Obstetrics & Gynecology

## 2024-03-28 VITALS — BP 109/71 | HR 94 | Wt 225.8 lb

## 2024-03-28 DIAGNOSIS — F418 Other specified anxiety disorders: Secondary | ICD-10-CM

## 2024-03-28 DIAGNOSIS — Z3A34 34 weeks gestation of pregnancy: Secondary | ICD-10-CM

## 2024-03-28 DIAGNOSIS — O99343 Other mental disorders complicating pregnancy, third trimester: Secondary | ICD-10-CM | POA: Diagnosis not present

## 2024-03-28 DIAGNOSIS — Z8679 Personal history of other diseases of the circulatory system: Secondary | ICD-10-CM | POA: Diagnosis not present

## 2024-03-28 DIAGNOSIS — O99013 Anemia complicating pregnancy, third trimester: Secondary | ICD-10-CM | POA: Diagnosis not present

## 2024-03-28 DIAGNOSIS — Z3480 Encounter for supervision of other normal pregnancy, unspecified trimester: Secondary | ICD-10-CM

## 2024-03-28 DIAGNOSIS — D649 Anemia, unspecified: Secondary | ICD-10-CM | POA: Diagnosis not present

## 2024-03-28 DIAGNOSIS — E039 Hypothyroidism, unspecified: Secondary | ICD-10-CM

## 2024-03-28 DIAGNOSIS — O99283 Endocrine, nutritional and metabolic diseases complicating pregnancy, third trimester: Secondary | ICD-10-CM

## 2024-03-28 NOTE — Progress Notes (Signed)
 Patient complaining of extreme brain fog

## 2024-03-28 NOTE — Progress Notes (Signed)
   PRENATAL VISIT NOTE  Subjective:  Carol Clark is a 28 y.o. G2P1001 at [redacted]w[redacted]d being seen today for ongoing prenatal care.  She is currently monitored for the following issues for this low-risk pregnancy and has Attention deficit disorder; Palpitations; Mixed anxiety and depressive disorder; Vitamin D  deficiency; Hypothyroidism; Hyperlipidemia; Supervision of other normal pregnancy, antepartum; Thyroid  disease affecting pregnancy; Obesity during pregnancy; History of palpitations in adulthood (Metoprolol  prn); and Anemia during pregnancy in third trimester on their problem list.  Patient reports no complaints.  Contractions: Irregular. Vag. Bleeding: None.  Movement: Present. Denies leaking of fluid.   The following portions of the patient's history were reviewed and updated as appropriate: allergies, current medications, past family history, past medical history, past social history, past surgical history and problem list.   Objective:   Vitals:   03/28/24 0914  BP: 109/71  Pulse: 94  Weight: 225 lb 12.8 oz (102.4 kg)    Fetal Status: Fetal Heart Rate (bpm): 137 Fundal Height: 35 cm  Movement: Present Presentation: Vertex  General:  Alert, oriented and cooperative. Patient is in no acute distress.  Skin: Skin is warm and dry. No rash noted.   Cardiovascular: Normal heart rate noted  Respiratory: Normal respiratory effort, no problems with respiration noted  Abdomen: Soft, gravid, appropriate for gestational age.  Pain/Pressure: Present     Pelvic: Cervical exam deferred        Extremities: Normal range of motion.  Edema: None  Mental Status: Normal mood and affect. Normal behavior. Normal judgment and thought content.   Assessment and Plan:  Pregnancy: G2P1001 at [redacted]w[redacted]d 1. Supervision of other normal pregnancy, antepartum (Primary) - on PNV - recheck 2 weeks  2. [redacted] weeks gestation of pregnancy  3. Anemia during pregnancy in third trimester - hb 10.5 done 02/11/2024  4.  Mixed anxiety and depressive disorder - on sertraline  100mg  and buspar   5. History of palpitations in adulthood (Metoprolol  prn) - has metoprolol  but has not needed during pregnancy  Preterm labor symptoms and general obstetric precautions including but not limited to vaginal bleeding, contractions, leaking of fluid and fetal movement were reviewed in detail with the patient. Please refer to After Visit Summary for other counseling recommendations.   Return in about 2 weeks (around 04/11/2024).  Future Appointments  Date Time Provider Department Center  04/14/2024  9:55 AM Lo, Arland POUR, CNM [facility] [id number] [facility]  04/21/2024 10:55 AM Lo, Arland POUR, CNM [id number]  04/28/2024 10:15 AM Tad, Arland POUR, CNM [id number] [facility]  05/07/2024  2:35 PM Cleotilde Ronal RAMAN, MD [facility] [phone] [facility]    Ronal RAMAN Cleotilde, MD

## 2024-03-31 ENCOUNTER — Encounter (HOSPITAL_BASED_OUTPATIENT_CLINIC_OR_DEPARTMENT_OTHER): Payer: Self-pay | Admitting: Obstetrics & Gynecology

## 2024-04-01 ENCOUNTER — Ambulatory Visit (HOSPITAL_BASED_OUTPATIENT_CLINIC_OR_DEPARTMENT_OTHER)

## 2024-04-01 VITALS — BP 99/78 | HR 82 | Wt 223.8 lb

## 2024-04-01 DIAGNOSIS — Z3A34 34 weeks gestation of pregnancy: Secondary | ICD-10-CM | POA: Diagnosis not present

## 2024-04-01 DIAGNOSIS — O4703 False labor before 37 completed weeks of gestation, third trimester: Secondary | ICD-10-CM | POA: Diagnosis not present

## 2024-04-01 DIAGNOSIS — O26899 Other specified pregnancy related conditions, unspecified trimester: Secondary | ICD-10-CM

## 2024-04-01 NOTE — Progress Notes (Signed)
 NURSE VISIT- NST  SUBJECTIVE:  Carol Clark is a 28 y.o. G8P1001 female at [redacted]w[redacted]d, here for a NST for contraction monitoring. She reports active fetal movement, contractions: irregular, vaginal bleeding: none, membranes: intact.   OBJECTIVE:  LMP  (LMP Unknown)   Appears well, no apparent distress  No results found for this or any previous visit (from the past 24 hours).  NST: FHR baseline 130 bpm, Variability: moderate, Accelerations:present, Decelerations:  Absent= Cat 1 /reactive Toco: none   ASSESSMENT: G2P1001 at [redacted]w[redacted]d NST reactive  PLAN: EFM strip reviewed by Recommendations: keep next appointment as scheduled    Morna LOISE Quale, RN

## 2024-04-14 ENCOUNTER — Other Ambulatory Visit (HOSPITAL_COMMUNITY)
Admission: RE | Admit: 2024-04-14 | Discharge: 2024-04-14 | Disposition: A | Source: Ambulatory Visit | Attending: Obstetrics & Gynecology | Admitting: Obstetrics & Gynecology

## 2024-04-14 ENCOUNTER — Ambulatory Visit (HOSPITAL_BASED_OUTPATIENT_CLINIC_OR_DEPARTMENT_OTHER): Admitting: Certified Nurse Midwife

## 2024-04-14 VITALS — BP 116/77 | HR 93 | Wt 228.6 lb

## 2024-04-14 DIAGNOSIS — D649 Anemia, unspecified: Secondary | ICD-10-CM

## 2024-04-14 DIAGNOSIS — Z3A36 36 weeks gestation of pregnancy: Secondary | ICD-10-CM

## 2024-04-14 DIAGNOSIS — O99013 Anemia complicating pregnancy, third trimester: Secondary | ICD-10-CM

## 2024-04-14 DIAGNOSIS — Z3480 Encounter for supervision of other normal pregnancy, unspecified trimester: Secondary | ICD-10-CM

## 2024-04-14 NOTE — Progress Notes (Signed)
 PRENATAL VISIT NOTE  Subjective:  Carol Clark is a 28 y.o. G2P1001 at [redacted]w[redacted]d being seen today for ongoing prenatal care.  She is currently monitored for the following issues for this  pregnancy and has Attention deficit disorder; Palpitations; Mixed anxiety and depressive disorder; Vitamin D  deficiency; Hypothyroidism; Hyperlipidemia; Supervision of other normal pregnancy, antepartum; Thyroid  disease affecting pregnancy; Obesity during pregnancy; History of palpitations in adulthood (Metoprolol  prn); and Anemia during pregnancy in third trimester on their problem list.  Patient reports no bleeding, no cramping, no leaking, and occasional contractions.  Contractions: Irregular. Vag. Bleeding: None.  Movement: Present. Denies leaking of fluid.   The following portions of the patient's history were reviewed and updated as appropriate: allergies, current medications, past family history, past medical history, past social history, past surgical history and problem list.   Objective:   Vitals:   04/14/24 0953  BP: 116/77  Pulse: 93  Weight: 228 lb 9.6 oz (103.7 kg)    Fetal Status:  Fetal Heart Rate (bpm): 144 Fundal Height: 36 cm Movement: Present Presentation: Vertex  General: Alert, oriented and cooperative. Patient is in no acute distress.  Skin: Skin is warm and dry. No rash noted.   Cardiovascular: Normal heart rate noted  Respiratory: Normal respiratory effort, no problems with respiration noted  Abdomen: Soft, gravid, appropriate for gestational age.  Pain/Pressure: Present (Some cramping)     Pelvic: Cervical exam performed in the presence of a chaperone Dilation: Fingertip Effacement (%): 50 Station: -3  Extremities: Normal range of motion.  Edema: Trace (After working shift)  Mental Status: Normal mood and affect. Normal behavior. Normal judgment and thought content.      02/11/2024    8:52 AM 10/16/2023    5:27 PM 07/26/2023    1:51 PM  Depression screen PHQ 2/9  Decreased  Interest 0 0 0  Down, Depressed, Hopeless 0 1 0  PHQ - 2 Score 0 1 0  Altered sleeping 0 1   Tired, decreased energy 0 0   Change in appetite 0 0   Feeling bad or failure about yourself  0 0   Trouble concentrating 0 0   Moving slowly or fidgety/restless 0 0   Suicidal thoughts 0 0   PHQ-9 Score 0  2    Difficult doing work/chores  Somewhat difficult      Data saved with a previous flowsheet row definition        02/11/2024    8:53 AM 10/16/2023    5:28 PM 01/18/2023    1:33 PM 01/01/2023   10:07 AM  GAD 7 : Generalized Anxiety Score  Nervous, Anxious, on Edge 0 0 1 1  Control/stop worrying 0 0 0 0  Worry too much - different things 0 0 0 1  Trouble relaxing 0 1 2 2   Restless 0 0 0 0  Easily annoyed or irritable 0 0 1 2  Afraid - awful might happen 0 0 0 0  Total GAD 7 Score 0 1 4 6   Anxiety Difficulty  Not difficult at all Somewhat difficult Somewhat difficult    Assessment and Plan:  Pregnancy: G2P1001 at [redacted]w[redacted]d  1. Supervision of other normal pregnancy, antepartum (Primary) - Plans bottlefeeding. Discussed tight-fitting support bra postpartum. Ibuprofen  prn postpartum. - Culture, beta strep (group b only) - Cervicovaginal ancillary only( [place])  2. [redacted] weeks gestation of pregnancy - Culture, beta strep (group b only) - Cervicovaginal ancillary only( [place])  3. Anemia during pregnancy in third  trimester - CBC  Preterm labor symptoms and general obstetric precautions including but not limited to vaginal bleeding, contractions, leaking of fluid and fetal movement were reviewed in detail with the patient. Please refer to After Visit Summary for other counseling recommendations.    Future Appointments  Date Time Provider Department Center  04/21/2024 10:55 AM Kaio Kuhlman, Arland POUR, CNM [id number]  04/28/2024 10:15 AM Tad, Arland POUR, CNM [facility]  05/07/2024  2:35 PM Cleotilde Ronal RAMAN, MD [facility] [phone] [facility]    Arland POUR Tad, CNM

## 2024-04-15 LAB — CERVICOVAGINAL ANCILLARY ONLY
Chlamydia: NEGATIVE
Comment: NEGATIVE
Comment: NEGATIVE
Comment: NORMAL
Neisseria Gonorrhea: NEGATIVE
Trichomonas: NEGATIVE

## 2024-04-16 ENCOUNTER — Ambulatory Visit (HOSPITAL_BASED_OUTPATIENT_CLINIC_OR_DEPARTMENT_OTHER): Payer: Self-pay | Admitting: Certified Nurse Midwife

## 2024-04-16 DIAGNOSIS — Z3480 Encounter for supervision of other normal pregnancy, unspecified trimester: Secondary | ICD-10-CM

## 2024-04-17 LAB — CULTURE, BETA STREP (GROUP B ONLY): Strep Gp B Culture: NEGATIVE

## 2024-04-21 ENCOUNTER — Ambulatory Visit (HOSPITAL_BASED_OUTPATIENT_CLINIC_OR_DEPARTMENT_OTHER): Admitting: Certified Nurse Midwife

## 2024-04-21 VITALS — BP 112/83 | HR 86 | Wt 228.2 lb

## 2024-04-21 DIAGNOSIS — Z3480 Encounter for supervision of other normal pregnancy, unspecified trimester: Secondary | ICD-10-CM

## 2024-04-21 DIAGNOSIS — O99013 Anemia complicating pregnancy, third trimester: Secondary | ICD-10-CM

## 2024-04-21 DIAGNOSIS — F418 Other specified anxiety disorders: Secondary | ICD-10-CM

## 2024-04-21 DIAGNOSIS — Z3A37 37 weeks gestation of pregnancy: Secondary | ICD-10-CM | POA: Diagnosis not present

## 2024-04-21 DIAGNOSIS — E079 Disorder of thyroid, unspecified: Secondary | ICD-10-CM

## 2024-04-21 DIAGNOSIS — Z8679 Personal history of other diseases of the circulatory system: Secondary | ICD-10-CM

## 2024-04-21 DIAGNOSIS — D649 Anemia, unspecified: Secondary | ICD-10-CM

## 2024-04-21 DIAGNOSIS — O99343 Other mental disorders complicating pregnancy, third trimester: Secondary | ICD-10-CM

## 2024-04-21 DIAGNOSIS — E039 Hypothyroidism, unspecified: Secondary | ICD-10-CM

## 2024-04-21 NOTE — Progress Notes (Addendum)
 PRENATAL VISIT NOTE  Subjective:  Carol Clark is a 28 y.o. G2P1001 at [redacted]w[redacted]d being seen today for ongoing prenatal care.  She is currently monitored for the following issues for this  pregnancy and has Attention deficit disorder; Palpitations; Mixed anxiety and depressive disorder; Vitamin D  deficiency; Hypothyroidism; Hyperlipidemia; Supervision of other normal pregnancy, antepartum; Thyroid  disease affecting pregnancy; Obesity during pregnancy; History of palpitations in adulthood (Metoprolol  prn); and Anemia during pregnancy in third trimester on their problem list.  Patient reports no bleeding, no leaking, and occasional contractions.  Contractions: Irregular. Vag. Bleeding: None.  Movement: Present. Denies leaking of fluid.   The following portions of the patient's history were reviewed and updated as appropriate: allergies, current medications, past family history, past medical history, past social history, past surgical history and problem list.   Objective:   Vitals:   04/21/24 1111  BP: 112/83  Pulse: 86  Weight: 228 lb 3.2 oz (103.5 kg)    Fetal Status:      Movement: Present    General: Alert, oriented and cooperative. Patient is in no acute distress.  Skin: Skin is warm and dry. No rash noted.   Cardiovascular: Normal heart rate noted  Respiratory: Normal respiratory effort, no problems with respiration noted  Abdomen: Soft, gravid, appropriate for gestational age.  Pain/Pressure: Present     Pelvic: Cervical exam performed in the presence of a chaperone        Extremities: Normal range of motion.  Edema: None  Mental Status: Normal mood and affect. Normal behavior. Normal judgment and thought content.      02/11/2024    8:52 AM 10/16/2023    5:27 PM 07/26/2023    1:51 PM  Depression screen PHQ 2/9  Decreased Interest 0 0 0  Down, Depressed, Hopeless 0 1 0  PHQ - 2 Score 0 1 0  Altered sleeping 0 1   Tired, decreased energy 0 0   Change in appetite 0 0    Feeling bad or failure about yourself  0 0   Trouble concentrating 0 0   Moving slowly or fidgety/restless 0 0   Suicidal thoughts 0 0   PHQ-9 Score 0  2    Difficult doing work/chores  Somewhat difficult      Data saved with a previous flowsheet row definition        02/11/2024    8:53 AM 10/16/2023    5:28 PM 01/18/2023    1:33 PM 01/01/2023   10:07 AM  GAD 7 : Generalized Anxiety Score  Nervous, Anxious, on Edge 0 0 1 1  Control/stop worrying 0 0 0 0  Worry too much - different things 0 0 0 1  Trouble relaxing 0 1 2 2   Restless 0 0 0 0  Easily annoyed or irritable 0 0 1 2  Afraid - awful might happen 0 0 0 0  Total GAD 7 Score 0 1 4 6   Anxiety Difficulty  Not difficult at all Somewhat difficult Somewhat difficult    Assessment and Plan:  Pregnancy: G2P1001 at [redacted]w[redacted]d  1. Supervision of other normal pregnancy, antepartum (Primary) - on PNV - recheck 1 week   2. [redacted] weeks gestation of pregnancy   3. Anemia during pregnancy in third trimester - hb 10.5 done 02/11/2024   4. Mixed anxiety and depressive disorder - on sertraline  100mg  and buspar    5. History of palpitations in adulthood (Metoprolol  prn) - has metoprolol  but has not needed during pregnancy  Term labor symptoms and general obstetric precautions including but not limited to vaginal bleeding, contractions, leaking of fluid and fetal movement were reviewed in detail with the patient. Please refer to After Visit Summary for other counseling recommendations.    Future Appointments  Date Time Provider Department Center  04/28/2024 10:15 AM Tad Arland POUR, CNM [id number]  05/07/2024  2:35 PM Cleotilde Ronal RAMAN, MD [facility] [phone] [facility]    Arland POUR Tad, CNM

## 2024-04-22 ENCOUNTER — Other Ambulatory Visit (HOSPITAL_BASED_OUTPATIENT_CLINIC_OR_DEPARTMENT_OTHER): Payer: Self-pay

## 2024-04-25 ENCOUNTER — Encounter (HOSPITAL_BASED_OUTPATIENT_CLINIC_OR_DEPARTMENT_OTHER): Payer: Self-pay

## 2024-04-28 ENCOUNTER — Ambulatory Visit (INDEPENDENT_AMBULATORY_CARE_PROVIDER_SITE_OTHER): Admitting: Certified Nurse Midwife

## 2024-04-28 VITALS — BP 110/69 | HR 92 | Wt 230.0 lb

## 2024-04-28 DIAGNOSIS — O99343 Other mental disorders complicating pregnancy, third trimester: Secondary | ICD-10-CM | POA: Diagnosis not present

## 2024-04-28 DIAGNOSIS — O99891 Other specified diseases and conditions complicating pregnancy: Secondary | ICD-10-CM

## 2024-04-28 DIAGNOSIS — Z3480 Encounter for supervision of other normal pregnancy, unspecified trimester: Secondary | ICD-10-CM

## 2024-04-28 DIAGNOSIS — D649 Anemia, unspecified: Secondary | ICD-10-CM | POA: Diagnosis not present

## 2024-04-28 DIAGNOSIS — O99013 Anemia complicating pregnancy, third trimester: Secondary | ICD-10-CM | POA: Diagnosis not present

## 2024-04-28 DIAGNOSIS — F418 Other specified anxiety disorders: Secondary | ICD-10-CM

## 2024-04-28 DIAGNOSIS — R002 Palpitations: Secondary | ICD-10-CM

## 2024-04-28 DIAGNOSIS — Z3A38 38 weeks gestation of pregnancy: Secondary | ICD-10-CM

## 2024-04-28 NOTE — Progress Notes (Signed)
 "  PRENATAL VISIT NOTE  Subjective:  Carol Clark is a 28 y.o. G2P1001 at [redacted]w[redacted]d being seen today for ongoing prenatal care.  She is currently monitored for the following issues for this  pregnancy and has Attention deficit disorder; Palpitations; Mixed anxiety and depressive disorder; Vitamin D  deficiency; Hypothyroidism; Hyperlipidemia; Supervision of other normal pregnancy, antepartum; Thyroid  disease affecting pregnancy; Obesity during pregnancy; History of palpitations in adulthood (Metoprolol  prn); and Anemia during pregnancy in third trimester on their problem list.  Patient reports no bleeding, no leaking, and occasional contractions.  Contractions: Irregular. Vag. Bleeding: None.  Movement: Present. Denies leaking of fluid.   The following portions of the patient's history were reviewed and updated as appropriate: allergies, current medications, past family history, past medical history, past social history, past surgical history and problem list.   Objective:   Vitals:   04/28/24 1026  BP: 110/69  Pulse: 92  Weight: 230 lb (104.3 kg)    Fetal Status:  Fetal Heart Rate (bpm): 150 Fundal Height: 37 cm Movement: Present Presentation: Vertex  General: Alert, oriented and cooperative. Patient is in no acute distress.  Skin: Skin is warm and dry. No rash noted.   Cardiovascular: Normal heart rate noted  Respiratory: Normal respiratory effort, no problems with respiration noted  Abdomen: Soft, gravid, appropriate for gestational age.  Pain/Pressure: Present     Pelvic: Cervical exam performed in the presence of a chaperone Dilation: 1.5 Effacement (%): 60 Station: -1  Extremities: Normal range of motion.  Edema: None  Mental Status: Normal mood and affect. Normal behavior. Normal judgment and thought content.      02/11/2024    8:52 AM 10/16/2023    5:27 PM 07/26/2023    1:51 PM  Depression screen PHQ 2/9  Decreased Interest 0 0 0  Down, Depressed, Hopeless 0 1 0  PHQ - 2 Score  0 1 0  Altered sleeping 0 1   Tired, decreased energy 0 0   Change in appetite 0 0   Feeling bad or failure about yourself  0 0   Trouble concentrating 0 0   Moving slowly or fidgety/restless 0 0   Suicidal thoughts 0 0   PHQ-9 Score 0  2    Difficult doing work/chores  Somewhat difficult      Data saved with a previous flowsheet row definition        02/11/2024    8:53 AM 10/16/2023    5:28 PM 01/18/2023    1:33 PM 01/01/2023   10:07 AM  GAD 7 : Generalized Anxiety Score  Nervous, Anxious, on Edge 0 0 1 1  Control/stop worrying 0 0 0 0  Worry too much - different things 0 0 0 1  Trouble relaxing 0 1 2 2   Restless 0 0 0 0  Easily annoyed or irritable 0 0 1 2  Afraid - awful might happen 0 0 0 0  Total GAD 7 Score 0 1 4 6   Anxiety Difficulty  Not difficult at all Somewhat difficult Somewhat difficult    Assessment and Plan:  Pregnancy: G2P1001 at [redacted]w[redacted]d  1. Supervision of other normal pregnancy, antepartum (Primary) - Taking PNV, likely having prodromal labor - Reports active fetal movement. No vaginal spotting or bleeding. No LOF from vagina. - GBS Negative - Out of work until delivery  2. [redacted] weeks gestation of pregnancy  3. Anemia during pregnancy in third trimester - hb 10.5 done 02/11/2024   4. Mixed anxiety and depressive disorder -  on sertraline  100mg  and buspar    5. History of palpitations in adulthood (Metoprolol  prn) - has metoprolol  but has not needed during pregnancy   Term labor symptoms and general obstetric precautions including but not limited to vaginal bleeding, contractions, leaking of fluid and fetal movement were reviewed in detail with the patient. Please refer to After Visit Summary for other counseling recommendations.    Future Appointments  Date Time Provider Department Center  05/07/2024  2:35 PM Cleotilde Ronal RAMAN, MD [id number] [facility]  05/14/2024  7:00 AM [facility] None    Arland MARLA Roller, CNM  "

## 2024-05-04 ENCOUNTER — Inpatient Hospital Stay (HOSPITAL_COMMUNITY): Admitting: Anesthesiology

## 2024-05-04 ENCOUNTER — Other Ambulatory Visit: Payer: Self-pay

## 2024-05-04 ENCOUNTER — Encounter (HOSPITAL_COMMUNITY): Payer: Self-pay | Admitting: Obstetrics & Gynecology

## 2024-05-04 ENCOUNTER — Inpatient Hospital Stay (HOSPITAL_COMMUNITY)
Admission: AD | Admit: 2024-05-04 | Discharge: 2024-05-05 | Disposition: A | Attending: Obstetrics & Gynecology | Admitting: Obstetrics & Gynecology

## 2024-05-04 DIAGNOSIS — O4292 Full-term premature rupture of membranes, unspecified as to length of time between rupture and onset of labor: Secondary | ICD-10-CM | POA: Diagnosis present

## 2024-05-04 DIAGNOSIS — Z3A39 39 weeks gestation of pregnancy: Secondary | ICD-10-CM

## 2024-05-04 DIAGNOSIS — Z8249 Family history of ischemic heart disease and other diseases of the circulatory system: Secondary | ICD-10-CM

## 2024-05-04 LAB — TYPE AND SCREEN
ABO/RH(D): A POS
Antibody Screen: NEGATIVE

## 2024-05-04 LAB — CBC
HCT: 32.9 % — ABNORMAL LOW (ref 36.0–46.0)
Hemoglobin: 10.7 g/dL — ABNORMAL LOW (ref 12.0–15.0)
MCH: 27.8 pg (ref 26.0–34.0)
MCHC: 32.5 g/dL (ref 30.0–36.0)
MCV: 85.5 fL (ref 80.0–100.0)
Platelets: 252 K/uL (ref 150–400)
RBC: 3.85 MIL/uL — ABNORMAL LOW (ref 3.87–5.11)
RDW: 14 % (ref 11.5–15.5)
WBC: 7.1 K/uL (ref 4.0–10.5)
nRBC: 0 % (ref 0.0–0.2)

## 2024-05-04 LAB — POCT FERN TEST: POCT Fern Test: POSITIVE

## 2024-05-04 MED ORDER — FENTANYL CITRATE (PF) 100 MCG/2ML IJ SOLN
INTRAMUSCULAR | Status: AC
  Administered 2024-05-04: 100 ug via INTRAVENOUS
  Filled 2024-05-04: qty 2

## 2024-05-04 MED ORDER — ONDANSETRON HCL 4 MG/2ML IJ SOLN: 4.0000 mg | INTRAMUSCULAR | Status: DC | PRN

## 2024-05-04 MED ORDER — DIBUCAINE (PERIANAL) 1 % EX OINT
1.0000 | TOPICAL_OINTMENT | CUTANEOUS | Status: DC | PRN
  Administered 2024-05-04: 1 via RECTAL
  Filled 2024-05-04: qty 28

## 2024-05-04 MED ORDER — LEVOTHYROXINE SODIUM 50 MCG PO TABS
50.0000 ug | ORAL_TABLET | Freq: Every day | ORAL | Status: DC
  Administered 2024-05-05: 50 ug via ORAL
  Filled 2024-05-04: qty 1

## 2024-05-04 MED ORDER — TERBUTALINE SULFATE 1 MG/ML IJ SOLN: 0.2500 mg | Freq: Once | INTRAMUSCULAR | Status: DC | PRN

## 2024-05-04 MED ORDER — LACTATED RINGERS IV SOLN: 500.0000 mL | Freq: Once | INTRAVENOUS | Status: DC

## 2024-05-04 MED ORDER — ACETAMINOPHEN 325 MG PO TABS: 650.0000 mg | ORAL_TABLET | ORAL | Status: DC | PRN

## 2024-05-04 MED ORDER — COCONUT OIL OIL: 1.0000 | TOPICAL_OIL | Status: DC | PRN

## 2024-05-04 MED ORDER — FENTANYL-BUPIVACAINE-NACL 0.5-0.125-0.9 MG/250ML-% EP SOLN
12.0000 mL/h | EPIDURAL | Status: DC | PRN
  Administered 2024-05-04: 12 mL/h via EPIDURAL
  Filled 2024-05-04: qty 250

## 2024-05-04 MED ORDER — EPHEDRINE 5 MG/ML INJ: 10.0000 mg | INTRAVENOUS | Status: DC | PRN

## 2024-05-04 MED ORDER — BUSPIRONE HCL 5 MG PO TABS
10.0000 mg | ORAL_TABLET | Freq: Every day | ORAL | Status: DC
  Administered 2024-05-05: 10 mg via ORAL
  Filled 2024-05-04: qty 1

## 2024-05-04 MED ORDER — LIDOCAINE HCL (PF) 1 % IJ SOLN
INTRAMUSCULAR | Status: DC | PRN
  Administered 2024-05-04: 5 mL via EPIDURAL

## 2024-05-04 MED ORDER — SERTRALINE HCL 100 MG PO TABS
200.0000 mg | ORAL_TABLET | Freq: Every day | ORAL | Status: DC
  Administered 2024-05-05: 200 mg via ORAL

## 2024-05-04 MED ORDER — SOD CITRATE-CITRIC ACID 500-334 MG/5ML PO SOLN: 30.0000 mL | ORAL | Status: DC | PRN

## 2024-05-04 MED ORDER — ONDANSETRON HCL 4 MG/2ML IJ SOLN
4.0000 mg | Freq: Four times a day (QID) | INTRAMUSCULAR | Status: DC | PRN
  Administered 2024-05-04: 4 mg via INTRAVENOUS
  Filled 2024-05-04: qty 2

## 2024-05-04 MED ORDER — PHENYLEPHRINE 80 MCG/ML (10ML) SYRINGE FOR IV PUSH (FOR BLOOD PRESSURE SUPPORT): 80.0000 ug | PREFILLED_SYRINGE | INTRAVENOUS | Status: DC | PRN

## 2024-05-04 MED ORDER — IBUPROFEN 600 MG PO TABS
600.0000 mg | ORAL_TABLET | Freq: Four times a day (QID) | ORAL | Status: DC
  Administered 2024-05-04 – 2024-05-05 (×3): 600 mg via ORAL
  Filled 2024-05-04: qty 1

## 2024-05-04 MED ORDER — OXYTOCIN-SODIUM CHLORIDE 30-0.9 UT/500ML-% IV SOLN: 2.5000 [IU]/h | INTRAVENOUS | Status: DC

## 2024-05-04 MED ORDER — LACTATED RINGERS IV SOLN: INTRAVENOUS | Status: DC

## 2024-05-04 MED ORDER — OXYCODONE HCL 5 MG PO TABS: 10.0000 mg | ORAL_TABLET | ORAL | Status: DC | PRN

## 2024-05-04 MED ORDER — BENZOCAINE-MENTHOL 20-0.5 % EX AERO
1.0000 | INHALATION_SPRAY | CUTANEOUS | Status: DC | PRN
  Administered 2024-05-04: 1 via TOPICAL
  Filled 2024-05-04: qty 56

## 2024-05-04 MED ORDER — OXYCODONE-ACETAMINOPHEN 5-325 MG PO TABS: 1.0000 | ORAL_TABLET | ORAL | Status: DC | PRN

## 2024-05-04 MED ORDER — SIMETHICONE 80 MG PO CHEW: 80.0000 mg | CHEWABLE_TABLET | ORAL | Status: DC | PRN

## 2024-05-04 MED ORDER — SERTRALINE HCL 100 MG PO TABS: 100.0000 mg | ORAL_TABLET | Freq: Every day | ORAL | Status: DC

## 2024-05-04 MED ORDER — LACTATED RINGERS IV SOLN: 500.0000 mL | INTRAVENOUS | Status: DC | PRN

## 2024-05-04 MED ORDER — ONDANSETRON HCL 4 MG PO TABS: 4.0000 mg | ORAL_TABLET | ORAL | Status: DC | PRN

## 2024-05-04 MED ORDER — WITCH HAZEL-GLYCERIN EX PADS
1.0000 | MEDICATED_PAD | CUTANEOUS | Status: DC | PRN
  Administered 2024-05-04: 1 via TOPICAL

## 2024-05-04 MED ORDER — OXYCODONE-ACETAMINOPHEN 5-325 MG PO TABS: 2.0000 | ORAL_TABLET | ORAL | Status: DC | PRN

## 2024-05-04 MED ORDER — DIPHENHYDRAMINE HCL 50 MG/ML IJ SOLN: 12.5000 mg | INTRAMUSCULAR | Status: DC | PRN

## 2024-05-04 MED ORDER — DIPHENHYDRAMINE HCL 25 MG PO CAPS: 25.0000 mg | ORAL_CAPSULE | Freq: Four times a day (QID) | ORAL | Status: DC | PRN

## 2024-05-04 MED ORDER — TETANUS-DIPHTH-ACELL PERTUSSIS 5-2-15.5 LF-MCG/0.5 IM SUSP: 0.5000 mL | Freq: Once | INTRAMUSCULAR | Status: DC

## 2024-05-04 MED ORDER — FLEET ENEMA RE ENEM: 1.0000 | ENEMA | RECTAL | Status: DC | PRN

## 2024-05-04 MED ORDER — LEVOTHYROXINE SODIUM 100 MCG PO TABS: 200.0000 ug | ORAL_TABLET | Freq: Every day | ORAL | Status: DC

## 2024-05-04 MED ORDER — OXYTOCIN BOLUS FROM INFUSION
333.0000 mL | Freq: Once | INTRAVENOUS | Status: AC
  Administered 2024-05-04: 333 mL via INTRAVENOUS

## 2024-05-04 MED ORDER — PRENATAL MULTIVITAMIN CH
1.0000 | ORAL_TABLET | Freq: Every day | ORAL | Status: DC
  Administered 2024-05-05: 1 via ORAL

## 2024-05-04 MED ORDER — LIDOCAINE HCL (PF) 1 % IJ SOLN: 30.0000 mL | INTRAMUSCULAR | Status: DC | PRN

## 2024-05-04 MED ORDER — OXYCODONE HCL 5 MG PO TABS: 5.0000 mg | ORAL_TABLET | ORAL | Status: DC | PRN

## 2024-05-04 MED ORDER — MEASLES, MUMPS & RUBELLA VAC ~~LOC~~ SUSR: 0.5000 mL | Freq: Once | SUBCUTANEOUS | Status: DC

## 2024-05-04 MED ORDER — OXYTOCIN-SODIUM CHLORIDE 30-0.9 UT/500ML-% IV SOLN
1.0000 m[IU]/min | INTRAVENOUS | Status: DC
  Administered 2024-05-04: 2 m[IU]/min via INTRAVENOUS
  Filled 2024-05-04: qty 500

## 2024-05-04 MED ORDER — MAGNESIUM HYDROXIDE 400 MG/5ML PO SUSP: 30.0000 mL | ORAL | Status: DC | PRN

## 2024-05-04 MED ORDER — FENTANYL CITRATE (PF) 100 MCG/2ML IJ SOLN: 100.0000 ug | INTRAMUSCULAR | Status: DC | PRN

## 2024-05-04 NOTE — Lactation Note (Signed)
 This note was copied from a baby's chart. Lactation Consultation Note  Patient Name: Carol Clark Date: 05/04/2024 Age:30 hours   Mom chooses to formula feed.  Maternal Data    Feeding    LATCH Score Latch: Grasps breast easily, tongue down, lips flanged, rhythmical sucking.  Audible Swallowing: Spontaneous and intermittent  Type of Nipple: Everted at rest and after stimulation  Comfort (Breast/Nipple): Soft / non-tender  Hold (Positioning): No assistance needed to correctly position infant at breast.  LATCH Score: 10   Lactation Tools Discussed/Used    Interventions    Discharge    Consult Status Consult Status: Complete    Jasreet Dickie G 05/04/2024, 7:18 PM

## 2024-05-04 NOTE — MAU Note (Signed)
 Carol Clark is a 28 y.o. at [redacted]w[redacted]d here in MAU reporting: possible ROM around 0715 that was clear. It has continued to leak out since then, is wearing a pad. Denies any VB. Reports +FM CTXs irregular and have been ongoing for awhile. Reports an uncomplicated pregnancy.  Onset of complaint: 0715 Pain score: 3 lower back/cramping- epidural eventually  Vitals:   05/04/24 0832  BP: 114/80  Pulse: 99  Resp: 18  Temp: 97.9 F (36.6 C)  SpO2: 97%     FHT:154 Lab orders placed from triage:  fern, Labor eval

## 2024-05-04 NOTE — Anesthesia Procedure Notes (Signed)
 Epidural Patient location during procedure: OB Start time: 05/04/2024 3:37 PM End time: 05/04/2024 3:49 PM  Staffing Anesthesiologist: Jefm Garnette LABOR, MD Performed: anesthesiologist   Preanesthetic Checklist Completed: patient identified, IV checked, site marked, risks and benefits discussed, surgical consent, monitors and equipment checked, pre-op evaluation and timeout performed  Epidural Patient position: sitting Prep: DuraPrep and site prepped and draped Patient monitoring: continuous pulse ox and blood pressure Approach: midline Location: L3-L4 Injection technique: LOR air  Needle:  Needle type: Tuohy  Needle gauge: 17 G Needle length: 9 cm and 9 Needle insertion depth: 7 cm Catheter type: closed end flexible Catheter size: 19 Gauge Catheter at skin depth: 13 cm Test dose: negative  Assessment Events: blood not aspirated, no cerebrospinal fluid, injection not painful, no injection resistance, no paresthesia and negative IV test  Additional Notes Patient identified. Risks/Benefits/Options discussed with patient including but not limited to bleeding, infection, nerve damage, paralysis, failed block, incomplete pain control, headache, blood pressure changes, nausea, vomiting, reactions to medication both or allergic, itching and postpartum back pain. Confirmed with bedside nurse the patient's most recent platelet count. Confirmed with patient that they are not currently taking any anticoagulation, have any bleeding history or any family history of bleeding disorders. Patient expressed understanding and wished to proceed. All questions were answered. Sterile technique was used throughout the entire procedure. Please see nursing notes for vital signs. Test dose was given through epidural needle and negative prior to continuing to dose epidural or start infusion. Warning signs of high block given to the patient including shortness of breath, tingling/numbness in hands, complete  motor block, or any concerning symptoms with instructions to call for help. Patient was given instructions on fall risk and not to get out of bed. All questions and concerns addressed with instructions to call with any issues.  1 Attempt (S) . Patient tolerated procedure well.

## 2024-05-04 NOTE — H&P (Signed)
 HPI: Carol Clark is a 28 y.o. year old G58P1001 female at [redacted]w[redacted]d weeks gestation US  at 8 weeks  who presents to MAU reporting Spontaneous rupture of membranes at 0715 on 05/04/2024.  Clear fluid. Contractions have increased but then spread out again after arriving on labor and delivery.  Dilation: 3.5 Effacement (%): 50 Station: -3 Presentation: Vertex Exam by:: Sheena, RN  US  03/19/2024:Est. FW: 2133 gm 4 lb 11 oz 46 %   History of vacuum-assisted vaginal delivery for maternal exhaustion in 2022 with 8 pound 2 ounce baby.  No complications.  NURSING  PROVIDER  Office Location [facility] Dating by Dating by 8-week US , Unsure LMP  So [facility] Model Traditional Anatomy U/S 01/21/2024 normal, f/u growth 32  Initiated care at  [facility]  English              LAB RESULTS   Support Person Prentice Banker NIPS: LR female AFP: screen negative    NT/IT (FT only)     Carrier Screen Horizon: Negative (2022)  Rhogam  A/Positive/-- (06/10 1653) A1C/GTT Early HgbA1C: 5.3 Third trimester 2 hr GTT:  73, 135, 110   Flu Vaccine 02/07/2024 (Pt got it at [facility])    TDaP Vaccine  02/07/2024 (Pt got it at [facility]) Blood Type A/Positive/-- (06/10 1653)  RSV Vaccine UTD 03/28/2024 Antibody Negative (06/10 1653)  COVID Vaccine unsure Rubella 1.56 (06/10 1653)  Feeding Plan bottle RPR Non Reactive (10/06 0843)  Contraception pill HBsAg Negative (06/10 1653)  Circumcision N/A HIV Non Reactive (10/06 0843)  Pediatrician  [facility] HCVAb  Neg  Prenatal Classes Did with first pregnancy, info given    BTL Consent  Pap Diagnosis  Date Value Ref Range Status  07/26/2023   Final   - Negative for intraepithelial lesion or malignancy (NILM)    BTL Pre-payment  GC/CT Initial:  neg/neg 36wks:  Neg/Neg  VBAC Consent N/A GBS Negative/-- (12/08 1039)   BRx Optimized? [ ]  yes   [ ]  no    DME Rx [ ]  BP cuff [ ]  Weight Scale Waterbirth  No, plans epidural  PHQ9 & GAD7 [x]  new OB [ x] 28  weeks  [  ] 36 weeks Induction  [ ]  Orders Entered [ ] Foley Y/N    OB History     Gravida  2   Para  1   Term  1   Preterm  0   AB  0   Living  1      SAB  0   IAB  0   Ectopic  0   Multiple  0   Live Births  1          Past Medical History:  Diagnosis Date   ADHD    Anxiety    Insomnia 11/22/2020   Palpitations 11/22/2020   Status post vacuum-assisted vaginal delivery 04/08/2021   Delivery 04/08/21   Vacuum-assisted vaginal delivery 2022   Past Surgical History:  Procedure Laterality Date   NO PAST SURGERIES     Family History: family history includes COPD in her paternal grandmother; Depression in her maternal grandfather; Fibromyalgia in her mother; Heart disease in her paternal grandfather; Hyperlipidemia in her father, maternal grandfather, and maternal grandmother; Hypertension in her father. Social History:  reports that she has never smoked. She has never been exposed to tobacco smoke. She has never used smokeless tobacco. She reports that  she does not currently use alcohol. She reports that she does not use drugs.     Maternal Diabetes: No Genetic Screening: Normal Maternal Ultrasounds/Referrals: Normal Fetal Ultrasounds or other Referrals:  None Maternal Substance Abuse:  No Significant Maternal Medications:  Meds include: Zoloft  Syntroid Other:  Significant Maternal Lab Results:  Group B Strep negative Number of Prenatal Visits:greater than 3 verified prenatal visits Maternal Vaccinations:RSV: Given during pregnancy >/=14 days ago, TDap, and Flu Other Comments:  On BuSpar   Review of Systems  Constitutional:  Negative for chills and fever.  Eyes:  Negative for visual disturbance.  Gastrointestinal:  Positive for abdominal pain. Negative for nausea and vomiting.  Genitourinary:  Negative for vaginal bleeding and vaginal discharge.  Neurological:  Negative for headaches.   Maternal Medical History:  Reason for admission:  Nausea.    Dilation: 3.5 Effacement (%): 50 Station: -3 Exam by:: Sheena, RN Blood pressure 110/76, pulse (!) 103, temperature 97.9 F (36.6 C), temperature source Oral, resp. rate 18, height 5' 6 (1.676 m), weight 104.6 kg, SpO2 95%. Maternal Exam:  Uterine Assessment: Contraction strength is moderate.  Contraction frequency is regular.  Abdomen: Patient reports no abdominal tenderness. Estimated fetal weight is 8 lb.   Fetal presentation: vertex Introitus: Vagina is negative for discharge.  Pelvis: adequate for delivery.   Cervix: Cervix evaluated by digital exam.     Fetal Exam Fetal Monitor Review: Baseline rate: 150.  Variability: moderate (6-25 bpm).   Pattern: accelerations present and no decelerations.   Fetal State Assessment: Category I - tracings are normal.   Physical Exam Constitutional:      General: She is in acute distress.     Appearance: She is well-developed. She is ill-appearing.  HENT:     Head: Normocephalic.  Eyes:     Conjunctiva/sclera: Conjunctivae normal.  Cardiovascular:     Rate and Rhythm: Normal rate and regular rhythm.     Heart sounds: Normal heart sounds.  Pulmonary:     Effort: Pulmonary effort is normal. No respiratory distress.     Breath sounds: Normal breath sounds.  Abdominal:     Palpations: Abdomen is soft.     Tenderness: There is no abdominal tenderness.  Genitourinary:    Vagina: No vaginal discharge or bleeding.  Musculoskeletal:        General: Normal range of motion.     Cervical back: Normal range of motion and neck supple.     Right lower leg: No edema.     Left lower leg: No edema.  Skin:    General: Skin is warm and dry.  Neurological:     Mental Status: She is alert and oriented to person, place, and time.  Psychiatric:        Mood and Affect: Mood normal.     Prenatal labs: ABO, Rh: --/--/A POS (12/28 9057) Antibody: NEG (12/28 0942) Rubella: 1.56 (06/10 1653) RPR: Non Reactive (10/06 0843)  HBsAg:  Negative (06/10 1653)  HIV: Non Reactive (10/06 0843)  GBS: Negative/-- (12/08 1039)   Assessment: 1. Labor: Early.  Contracting irregularly.  Will start Pitocin . 2. Fetal Wellbeing: Category 1 3. Pain Control: IV pain meds 4. GBS: Negative 5.  39.4 week IUP 6.  Plans to breast-feed 7.  Family planning: OCPs  Plan:  1. Admit to BS per consult with MD 2. Routine L&D orders 3. Analgesia/anesthesia PRN  4. Start Pitocin  due to SROM this morning at 0715 without active labor.  Daiki Dicostanzo  Claudene 05/04/2024, 12:48 PM

## 2024-05-04 NOTE — Progress Notes (Signed)
 Carol Clark is a 28 y.o. G2P1001 at [redacted]w[redacted]d.  Subjective: Comfortable with epidural  Objective: BP (!) 98/55   Pulse 73   Temp 97.9 F (36.6 C) (Oral)   Resp 18   Ht 5' 6 (1.676 m)   Wt 104.6 kg   LMP  (LMP Unknown)   SpO2 95%   BMI 37.24 kg/m    FHT:  FHR: 140 bpm, variability: Moderate,  accelerations: 15 X15,  decelerations: Few variables UC:   Q 2-4 minutes, strong Dilation: 5 Effacement (%): 80 Station: -3 Presentation: Vertex Exam by:: Sheena, RN  Labs: Results for orders placed or performed during the hospital encounter of 05/04/24 (from the past 24 hours)  POCT fern test     Status: Abnormal   Collection Time: 05/04/24  8:54 AM  Result Value Ref Range   POCT Fern Test Positive = ruptured amniotic membanes   CBC     Status: Abnormal   Collection Time: 05/04/24  9:42 AM  Result Value Ref Range   WBC 7.1 4.0 - 10.5 K/uL   RBC 3.85 (L) 3.87 - 5.11 MIL/uL   Hemoglobin 10.7 (L) 12.0 - 15.0 g/dL   HCT 67.0 (L) 63.9 - 53.9 %   MCV 85.5 80.0 - 100.0 fL   MCH 27.8 26.0 - 34.0 pg   MCHC 32.5 30.0 - 36.0 g/dL   RDW 85.9 88.4 - 84.4 %   Platelets 252 150 - 400 K/uL   nRBC 0.0 0.0 - 0.2 %  Type and screen [facility]     Status: None   Collection Time: 05/04/24  9:42 AM  Result Value Ref Range   ABO/RH(D) A POS    Antibody Screen NEG    Sample Expiration      05/07/2024,2359 Performed at [facility], [address], [place], [place] [address]     Assessment / Plan: [redacted]w[redacted]d week IUP ROM x Hours: 9 Minutes: 40 Labor: Early, progressing well on Pitocin . Fetal Wellbeing:  Category 1 Pain Control: Epidural Anticipated MOD: SVD  Victory Dresden , CNM 05/04/2024 4:55 PM

## 2024-05-04 NOTE — Anesthesia Preprocedure Evaluation (Addendum)
 "                                  Anesthesia Evaluation  Patient identified by MRN, date of birth, ID band Patient awake    Reviewed: Allergy & Precautions, NPO status , Patient's Chart, lab work & pertinent test results  Airway Mallampati: II  TM Distance: >3 FB Neck ROM: Full    Dental no notable dental hx. (+) Teeth Intact, Dental Advisory Given   Pulmonary neg pulmonary ROS   Pulmonary exam normal breath sounds clear to auscultation       Cardiovascular hypertension, Normal cardiovascular exam+ dysrhythmias (palpitations)  Rhythm:Regular Rate:Normal     Neuro/Psych  PSYCHIATRIC DISORDERS Anxiety Depression    negative neurological ROS     GI/Hepatic negative GI ROS, Neg liver ROS,,,  Endo/Other  Hypothyroidism    Renal/GU      Musculoskeletal negative musculoskeletal ROS (+)    Abdominal   Peds  Hematology Lab Results      Component                Value               Date                      WBC                      7.1                 05/04/2024                HGB                      10.7 (L)            05/04/2024                HCT                      32.9 (L)            05/04/2024                MCV                      85.5                05/04/2024                PLT                      252                 05/04/2024              Anesthesia Other Findings   Reproductive/Obstetrics (+) Pregnancy                              Anesthesia Physical Anesthesia Plan  ASA: 3  Anesthesia Plan: Epidural   Post-op Pain Management:    Induction:   PONV Risk Score and Plan:   Airway Management Planned:   Additional Equipment:   Intra-op Plan:   Post-operative Plan:   Informed Consent: I have reviewed the patients History and Physical, chart, labs and discussed the  procedure including the risks, benefits and alternatives for the proposed anesthesia with the patient or authorized representative who has  indicated his/her understanding and acceptance.       Plan Discussed with:   Anesthesia Plan Comments: (39.4 wk G2P1 w BMI 37.2 and Hx of Hypothyroidism and palpitations)         Anesthesia Quick Evaluation  "

## 2024-05-05 ENCOUNTER — Encounter (HOSPITAL_BASED_OUTPATIENT_CLINIC_OR_DEPARTMENT_OTHER): Admitting: Certified Nurse Midwife

## 2024-05-05 ENCOUNTER — Other Ambulatory Visit (HOSPITAL_BASED_OUTPATIENT_CLINIC_OR_DEPARTMENT_OTHER): Payer: Self-pay

## 2024-05-05 LAB — CBC
HCT: 29.8 % — ABNORMAL LOW (ref 36.0–46.0)
Hemoglobin: 9.7 g/dL — ABNORMAL LOW (ref 12.0–15.0)
MCH: 27.7 pg (ref 26.0–34.0)
MCHC: 32.6 g/dL (ref 30.0–36.0)
MCV: 85.1 fL (ref 80.0–100.0)
Platelets: 199 K/uL (ref 150–400)
RBC: 3.5 MIL/uL — ABNORMAL LOW (ref 3.87–5.11)
RDW: 13.8 % (ref 11.5–15.5)
WBC: 11 K/uL — ABNORMAL HIGH (ref 4.0–10.5)
nRBC: 0 % (ref 0.0–0.2)

## 2024-05-05 LAB — SYPHILIS: RPR W/REFLEX TO RPR TITER AND TREPONEMAL ANTIBODIES, TRADITIONAL SCREENING AND DIAGNOSIS ALGORITHM: RPR Ser Ql: NONREACTIVE

## 2024-05-05 MED ORDER — FERROUS SULFATE 325 (65 FE) MG PO TABS
325.0000 mg | ORAL_TABLET | ORAL | Status: DC
  Administered 2024-05-05: 325 mg via ORAL
  Filled 2024-05-05: qty 1

## 2024-05-05 MED ORDER — ACETAMINOPHEN 325 MG PO TABS: 650.0000 mg | ORAL_TABLET | ORAL | Status: DC | PRN

## 2024-05-05 MED ORDER — IBUPROFEN 600 MG PO TABS
600.0000 mg | ORAL_TABLET | Freq: Four times a day (QID) | ORAL | 1 refills | Status: DC
  Filled 2024-05-05: qty 30, 8d supply, fill #0

## 2024-05-05 NOTE — Progress Notes (Signed)
 Post Partum Day #1  Subjective: She is ambulating, is tolerating po, and she is voiding spontaneously.  Her pain is well controlled on PO pain medications. Her lochia is less than menses.   Objective: Blood pressure 103/70, pulse 82, temperature 98 F (36.7 C), temperature source Oral, resp. rate 17, height 5' 6 (1.676 m), weight 104.6 kg, SpO2 99%, unknown if currently breastfeeding.  Physical Exam:  General: alert, cooperative, appears stated age, and no distress Lochia: appropriate Uterine Fundus: firm DVT Evaluation: No evidence of DVT seen on physical exam.  Recent Labs    05/04/24 0942 05/05/24 0536  HGB 10.7* 9.7*  HCT 32.9* 29.8*    Assessment/Plan: - Continue routine postpartum care.  - Plan for discharge tomorrow.  - Hgb: 9.7 (05/05/24 @ 0536)  - ferrous sulfate  325 mg PO ordered every other day (05/05/2024)    LOS: 1 day   Carol Clark, CNM 05/05/2024, 11:25 AM

## 2024-05-05 NOTE — Anesthesia Postprocedure Evaluation (Signed)
"   Anesthesia Post Note  Patient: Carol Clark  Procedure(s) Performed: AN AD HOC LABOR EPIDURAL     Patient location during evaluation: Mother Baby Anesthesia Type: Epidural Level of consciousness: awake and alert and oriented Pain management: satisfactory to patient Vital Signs Assessment: post-procedure vital signs reviewed and stable Respiratory status: respiratory function stable Cardiovascular status: stable Postop Assessment: no headache, no backache, epidural receding, patient able to bend at knees, no signs of nausea or vomiting, adequate PO intake and able to ambulate Anesthetic complications: no   No notable events documented.  Last Vitals:  Vitals:   05/04/24 2358 05/05/24 0538  BP: 118/75 114/75  Pulse: 74 72  Resp: 18 18  Temp: 36.8 C 36.8 C  SpO2:      Last Pain:  Vitals:   05/05/24 0542  TempSrc:   PainSc: 0-No pain   Pain Goal:                   Hoy LITTIE Sharps      "

## 2024-05-05 NOTE — Discharge Summary (Signed)
 "    Postpartum Discharge Summary      Patient Name: Carol Clark DOB: [birth year] MRN: [id number]  Date of admission: 05/04/2024 Delivery date:05/04/2024 Delivering provider: CLAUDENE, Nanea Jared  Date of discharge: 05/05/2024  Admitting diagnosis: Labor and delivery, indication for care [O75.9] Intrauterine pregnancy: [redacted]w[redacted]d     Secondary diagnosis:  Principal Problem:   Vaginal delivery Active Problems:   Labor and delivery, indication for care  Additional problems: None    Discharge diagnosis: Term Pregnancy Delivered                                              Post partum procedures:None Augmentation: Pitocin  Complications: None  Hospital course: Onset of Labor With Vaginal Delivery      28 y.o. yo [id number] at [redacted]w[redacted]d was admitted in Latent Labor on 05/04/2024. Labor course was complicated by nothing Membrane Rupture Time/Date: 7:15 AM,05/04/2024  Delivery Method:Vaginal, Spontaneous Operative Delivery:N/A Episiotomy: None Lacerations:  2nd degree Patient had a postpartum course complicated by nothing.  She is ambulating, tolerating a regular diet, passing flatus, and urinating well. Patient is discharged home in stable condition on 05/05/2024.  Newborn Data: Birth date:05/04/2024 Birth time:5:25 PM Gender:Female Living status:Living Apgars:4 ,8  Weight:3380 g  Magnesium  Sulfate received: No BMZ received: No Rhophylac:N/A MMR:N/A T-DaP:Given prenatally Flu: Yes RSV Vaccine received: Yes Transfusion:No  Immunizations received: Immunization History  Administered Date(s) Administered    sv, Bivalent, Protein Subunit Rsvpref,pf (Abrysvo ) 03/28/2024   DTaP 12/31/1998   Hep B, Unspecified 07/06/2010, 08/19/2010, 12/15/2010   Hepatitis A 07/06/2010, 01/05/2011   Hepatitis A, Ped/Adol-2 Dose 07/06/2010, 01/05/2011   Hepatitis B 07/06/2010, 08/19/2010, 12/15/2010   IPV 12/31/1998, 12/15/2010, 02/23/2011   Influenza Split 02/17/2016, 01/13/2020   Influenza,inj,Quad  PF,6+ Mos 04/21/2014, 01/24/2018   Influenza,inj,quad, With Preservative 02/15/2023   Influenza,trivalent, recombinat, inj, PF 02/17/2016, 01/13/2020   Influenza-Unspecified 04/21/2014, 02/17/2016, 02/11/2021, 02/13/2022   MMR 01/10/1995, 12/31/1998   Meningococcal Conjugate 01/05/2011, 01/05/2011   PFIZER(Purple Top)SARS-COV-2 Vaccination 05/13/2019, 06/03/2019, 03/19/2020, 02/15/2023   Polio, Unspecified 12/15/2010, 02/23/2011   Td 11/18/2016   Td (Adult), 2 Lf Tetanus Toxid, Preservative Free 11/18/2016   Tdap 12/31/1998, 07/06/2010, 05/08/2016, 01/17/2021, 02/07/2024   Typhoid Inactivated 09/22/2014   Varicella 12/31/1998, 08/19/2010    Physical exam  Vitals:   05/04/24 2358 05/05/24 0538 05/05/24 0938 05/05/24 1450  BP: 118/75 114/75 103/70 117/83  Pulse: 74 72 82 80  Resp: 18 18 17 18   Temp: 98.3 F (36.8 C) 98.2 F (36.8 C) 98 F (36.7 C) 98.1 F (36.7 C)  TempSrc: Oral Oral Oral Oral  SpO2:   99% 97%  Weight:      Height:       General: alert, cooperative, and no distress Lochia: appropriate Uterine Fundus: firm Incision: N/A DVT Evaluation: No evidence of DVT seen on physical exam. Labs: Lab Results  Component Value Date   WBC 11.0 (H) 05/05/2024   HGB 9.7 (L) 05/05/2024   HCT 29.8 (L) 05/05/2024   MCV 85.1 05/05/2024   PLT 199 05/05/2024      Latest Ref Rng & Units 09/28/2023    4:47 PM  CMP  Glucose 70 - 99 mg/dL 82   BUN 6 - 20 mg/dL 12   Creatinine 9.55 - 1.00 mg/dL 9.25   Sodium 864 - 854 mmol/L 136   Potassium 3.5 - 5.1 mmol/L 3.9   Chloride 98 -  111 mmol/L 101   CO2 22 - 32 mmol/L 19   Calcium 8.9 - 10.3 mg/dL 9.6   Total Protein 6.5 - 8.1 g/dL 7.9   Total Bilirubin 0.0 - 1.2 mg/dL 0.5   Alkaline Phos 38 - 126 U/L 59   AST 15 - 41 U/L 18   ALT 0 - 44 U/L 13    Edinburgh Score:    05/05/2024   11:45 AM  Edinburgh Postnatal Depression Scale Screening Tool  I have been able to laugh and see the funny side of things. 0  I have looked  forward with enjoyment to things. 0  I have blamed myself unnecessarily when things went wrong. 1  I have been anxious or worried for no good reason. 1  I have felt scared or panicky for no good reason. 1  Things have been getting on top of me. 1  I have been so unhappy that I have had difficulty sleeping. 0  I have felt sad or miserable. 1  I have been so unhappy that I have been crying. 1  The thought of harming myself has occurred to me. 0  Edinburgh Postnatal Depression Scale Total 6   Edinburgh Postnatal Depression Scale Total: 6   After visit meds:  Allergies as of 05/05/2024   No Known Allergies      Medication List     TAKE these medications    acetaminophen  325 MG tablet Commonly known as: Tylenol  Take 2 tablets (650 mg total) by mouth every 4 (four) hours as needed (for pain scale < 4).   busPIRone  5 MG tablet Commonly known as: BUSPAR  TAKE 1 TABLET BY MOUTH 2 TIMES A DAY   ibuprofen  600 MG tablet Commonly known as: ADVIL  Take 1 tablet (600 mg total) by mouth every 6 (six) hours.   Iron -Vitamin C  65-125 MG Tabs Take 1 tablet by mouth 3 (three) times daily.   levothyroxine  50 MCG tablet Commonly known as: SYNTHROID  Take 1 tablet (50 mcg total) by mouth daily.   metoprolol  tartrate 25 MG tablet Commonly known as: LOPRESSOR  Take 0.5 tablets (12.5 mg total) by mouth 2 (two) times daily as needed.   prenatal multivitamin Tabs tablet Take 1 tablet by mouth daily at 12 noon.   sertraline  100 MG tablet Commonly known as: ZOLOFT  Take 2 tablets (200 mg total) by mouth in the morning.         Discharge home in stable condition Infant Feeding: Bottle Infant Disposition:home with mother Discharge instruction: per After Visit Summary and Postpartum booklet. Activity: Advance as tolerated. Pelvic rest for 6 weeks.  Diet: iron  rich diet Future Appointments: Future Appointments  Date Time Provider Department Center  06/17/2024  9:15 AM Delores Nidia CROME,  FNP [facility] [phone] [facility]   Follow up Visit:  Follow-up Information     [facility] Follow up.   Specialty: Obstetrics and Gynecology Why: 4-6 weeks postpartum or sooner as needed Contact information: [address]  [place]  [address] [phone]        [facility] Follow up.   Specialty: Obstetrics and Gynecology Why: As needed in emergencies Contact information: [address] [place]  [phone]                 Please schedule this patient for a In person postpartum visit in 4 weeks with the following provider: Any provider. Additional Postpartum F/U:None  Low risk pregnancy  complicated by: Nothing Delivery mode:  Vaginal, Spontaneous Anticipated Birth Control:  POPs   05/05/2024 Jaskaran Dauzat  Claudene, CNM    "

## 2024-05-05 NOTE — Plan of Care (Signed)
" °  Problem: Education: Goal: Knowledge of General Education information will improve Description: Including pain rating scale, medication(s)/side effects and non-pharmacologic comfort measures Outcome: Adequate for Discharge   Problem: Health Behavior/Discharge Planning: Goal: Ability to manage health-related needs will improve Outcome: Adequate for Discharge   Problem: Clinical Measurements: Goal: Ability to maintain clinical measurements within normal limits will improve Outcome: Adequate for Discharge Goal: Will remain free from infection Outcome: Adequate for Discharge Goal: Diagnostic test results will improve Outcome: Adequate for Discharge Goal: Respiratory complications will improve Outcome: Adequate for Discharge Goal: Cardiovascular complication will be avoided Outcome: Adequate for Discharge   Problem: Activity: Goal: Risk for activity intolerance will decrease Outcome: Adequate for Discharge   Problem: Nutrition: Goal: Adequate nutrition will be maintained Outcome: Adequate for Discharge   Problem: Elimination: Goal: Will not experience complications related to bowel motility Outcome: Adequate for Discharge Goal: Will not experience complications related to urinary retention Outcome: Adequate for Discharge   Problem: Pain Managment: Goal: General experience of comfort will improve and/or be controlled Outcome: Adequate for Discharge   Problem: Safety: Goal: Ability to remain free from injury will improve Outcome: Adequate for Discharge   Problem: Activity: Goal: Will verbalize the importance of balancing activity with adequate rest periods Outcome: Adequate for Discharge Goal: Ability to tolerate increased activity will improve Outcome: Adequate for Discharge   "

## 2024-05-05 NOTE — Progress Notes (Signed)
 [name] was referred for history of depression/anxiety.  * Referral screened out by Clinical Social Worker because none of the following criteria appear to apply:  ~ History of anxiety/depression during this pregnancy, or of post-partum depression following prior delivery.  ~ Diagnosis of anxiety and/or depression within last 3 years  OR  * [name]'s symptoms currently being treated with medication and/or therapy.  Per OB notes, [name] has an active prescription for Buspar 10mg  and Zoloft 100mg  for support.  Edinburgh score 6  Please contact the Clinical Social Worker if needs arise, by [facility] request, or if [name] scores greater than 9/yes to question 10 on Edinburgh Postpartum Depression Screen.  [name], [place] Clinical Social Worker [phone]

## 2024-05-06 ENCOUNTER — Other Ambulatory Visit (HOSPITAL_BASED_OUTPATIENT_CLINIC_OR_DEPARTMENT_OTHER): Payer: Self-pay

## 2024-05-06 DIAGNOSIS — Z3480 Encounter for supervision of other normal pregnancy, unspecified trimester: Secondary | ICD-10-CM

## 2024-05-06 MED ORDER — LEVOTHYROXINE SODIUM 50 MCG PO TABS
50.0000 ug | ORAL_TABLET | Freq: Every day | ORAL | 0 refills | Status: AC
  Filled 2024-05-06: qty 90, 90d supply, fill #0

## 2024-05-06 NOTE — Telephone Encounter (Signed)
 Patient requested a refill. Per Arland Roller it is ok to refill. Refill sent to [facility]. tbw

## 2024-05-07 ENCOUNTER — Encounter (HOSPITAL_BASED_OUTPATIENT_CLINIC_OR_DEPARTMENT_OTHER): Admitting: Obstetrics & Gynecology

## 2024-05-09 ENCOUNTER — Other Ambulatory Visit: Payer: Self-pay

## 2024-05-09 ENCOUNTER — Inpatient Hospital Stay (HOSPITAL_COMMUNITY)
Admission: AD | Admit: 2024-05-09 | Discharge: 2024-05-09 | Disposition: A | Discharge status: Home / Self Care | Attending: Obstetrics and Gynecology | Admitting: Obstetrics and Gynecology

## 2024-05-09 ENCOUNTER — Telehealth (HOSPITAL_BASED_OUTPATIENT_CLINIC_OR_DEPARTMENT_OTHER): Payer: Self-pay

## 2024-05-09 DIAGNOSIS — O9089 Other complications of the puerperium, not elsewhere classified: Secondary | ICD-10-CM | POA: Insufficient documentation

## 2024-05-09 DIAGNOSIS — R519 Headache, unspecified: Secondary | ICD-10-CM | POA: Insufficient documentation

## 2024-05-09 LAB — URINALYSIS, ROUTINE W REFLEX MICROSCOPIC
Bacteria, UA: NONE SEEN
Bilirubin Urine: NEGATIVE
Glucose, UA: NEGATIVE mg/dL
Ketones, ur: NEGATIVE mg/dL
Nitrite: NEGATIVE
Protein, ur: 100 mg/dL — AB
Specific Gravity, Urine: 1.039 — ABNORMAL HIGH (ref 1.005–1.030)
pH: 5 (ref 5.0–8.0)

## 2024-05-09 MED ORDER — ACETAMINOPHEN-CAFFEINE 500-65 MG PO TABS
2.0000 | ORAL_TABLET | Freq: Once | ORAL | Status: AC
  Administered 2024-05-09: 2 via ORAL
  Filled 2024-05-09: qty 2

## 2024-05-09 NOTE — MAU Provider Note (Signed)
 CC/ HA PP NSVD 05/04/24, DC 05/07/24   S/HPI Carol Clark is a 29 y.o. [id number] patient who presents to MAU today with complaint of status post vaginal delivery 05/04/2024 reports she has had a headache for the past 2 days with increased intermediate vaginal bleeding.  She reports she changed her pad 3 times today.  Her headache has not responded to Tylenol  and ibuprofen  and rates her headache at 4 out of 10.  Patient denies any visual changes, shortness of breath, chest pain, fever, chills, sick contacts and offers no other complaints at this time.  She is currently not breast-feeding   O BP 106/68 (BP Location: Right Arm)   Pulse 96   Temp 98.1 F (36.7 C) (Oral)   SpO2 98%  Physical Exam Vitals and nursing note reviewed.  Constitutional:      General: She is not in acute distress.    Appearance: Normal appearance. She is obese. She is not ill-appearing.  HENT:     Head: Normocephalic.     Nose: Nose normal.     Mouth/Throat:     Mouth: Mucous membranes are moist.  Cardiovascular:     Rate and Rhythm: Normal rate.  Pulmonary:     Effort: Pulmonary effort is normal.  Abdominal:     General: There is no distension.     Palpations: Abdomen is soft.     Tenderness: There is no abdominal tenderness.  Musculoskeletal:        General: Normal range of motion.  Skin:    General: Skin is warm.  Neurological:     Mental Status: She is oriented to person, place, and time.  Psychiatric:        Mood and Affect: Mood normal.        Behavior: Behavior normal.     MDM  LOW  Prenatal chart reviewed including inpatient records Physical exam performed Excedrin for headache administered: Patient reports relief from Excedrin given with resolve Patient reassessed and  feels better  Stable for discharge home   Meds ordered this encounter  Medications   acetaminophen -caffeine (EXCEDRIN TENSION HEADACHE) 500-65 MG per tablet 2 tablet     Orders Placed This Encounter   Procedures   Culture, OB Urine    Standing Status:   Standing    Number of Occurrences:   1   Urinalysis, Routine w reflex microscopic -Urine, Clean Catch    Standing Status:   Standing    Number of Occurrences:   1    Specimen Source:   Urine, Clean Catch [76]   Discharge patient Discharge disposition: 01-Home or Self Care; Discharge patient date: 05/09/2024    Standing Status:   Standing    Number of Occurrences:   1    Discharge disposition:   01-Home or Self Care [1]    Discharge patient date:   05/09/2024      Results for orders placed or performed during the hospital encounter of 05/09/24 (from the past 24 hours)  Urinalysis, Routine w reflex microscopic -Urine, Clean Catch     Status: Abnormal   Collection Time: 05/09/24  4:39 PM  Result Value Ref Range   Color, Urine AMBER (A) YELLOW   APPearance HAZY (A) CLEAR   Specific Gravity, Urine 1.039 (H) 1.005 - 1.030   pH 5.0 5.0 - 8.0   Glucose, UA NEGATIVE NEGATIVE mg/dL   Hgb urine dipstick SMALL (A) NEGATIVE   Bilirubin Urine NEGATIVE NEGATIVE   Ketones, ur NEGATIVE NEGATIVE mg/dL  Protein, ur 100 (A) NEGATIVE mg/dL   Nitrite NEGATIVE NEGATIVE   Leukocytes,Ua MODERATE (A) NEGATIVE   RBC / HPF 6-10 0 - 5 RBC/hpf   WBC, UA 11-20 0 - 5 WBC/hpf   Bacteria, UA NONE SEEN NONE SEEN   Squamous Epithelial / HPF 0-5 0 - 5 /HPF   Mucus PRESENT      ASSESSMENT Medical screening exam complete  Nonintractable headache, unspecified chronicity pattern, unspecified headache type  Postpartum state    PLAN Future Appointments  Date Time Provider Department Center  06/17/2024  9:15 AM Delores Nidia CROME, FNP [facility] [phone] [facility]    Discharge from MAU in stable condition  No acute process   HA resolved with Tylenol   PP education provided to the patient and she will f/u as scheduled for routine PP Care  Urine C/S sent for likely contaminated sample  See AVS for full description of educational information and instructions  provided to the patient at time of discharge   Warning signs for worsening condition that would warrant emergency follow-up discussed  Patient may return to MAU as needed  ----------------------------------------------------------- Carol Dalton, MSN, [facility], Center for [facility]

## 2024-05-09 NOTE — Telephone Encounter (Signed)
 Patient called and left a message on the voicemail about her bleeding getting worse and having a continuous headache. She states that the swelling is better. Patient was advised to go to MAU for evaluation. tbw

## 2024-05-09 NOTE — MAU Note (Signed)
 Carol Clark is a 29 y.o. at Unknown here in MAU reporting: PP vag delivery HA past 2 days, increase in VB- moderate, 3 pads changed today- denies extra activity, and chills-  Tylenol  and Ibuprofen - takes the edge off but not going away. Denies visual disturbance or chest pain LMP: na Onset of complaint: 2 days Pain score: HA 4/10 Vitals:   05/09/24 1526  BP: 106/68  Pulse: 96  Temp: 98.1 F (36.7 C)  SpO2: 98%     FHT: na  Lab orders placed from triage: ua

## 2024-05-10 ENCOUNTER — Other Ambulatory Visit (HOSPITAL_BASED_OUTPATIENT_CLINIC_OR_DEPARTMENT_OTHER): Payer: Self-pay | Admitting: Family Medicine

## 2024-05-10 DIAGNOSIS — F419 Anxiety disorder, unspecified: Secondary | ICD-10-CM

## 2024-05-11 LAB — CULTURE, OB URINE: Culture: 60000 — AB

## 2024-05-12 ENCOUNTER — Other Ambulatory Visit: Payer: Self-pay

## 2024-05-12 ENCOUNTER — Other Ambulatory Visit (HOSPITAL_BASED_OUTPATIENT_CLINIC_OR_DEPARTMENT_OTHER): Payer: Self-pay

## 2024-05-12 ENCOUNTER — Encounter (HOSPITAL_BASED_OUTPATIENT_CLINIC_OR_DEPARTMENT_OTHER): Admitting: Obstetrics & Gynecology

## 2024-05-12 MED ORDER — SCOPOLAMINE 1 MG/3DAYS TD PT72: 1.0000 | MEDICATED_PATCH | TRANSDERMAL | 2 refills | Status: DC

## 2024-05-12 MED FILL — Buspirone HCl Tab 5 MG: ORAL | 90 days supply | Qty: 180 | Fill #0 | Status: CN

## 2024-05-13 ENCOUNTER — Telehealth (HOSPITAL_COMMUNITY): Payer: Self-pay | Admitting: *Deleted

## 2024-05-13 NOTE — Telephone Encounter (Signed)
 05/13/2024  Name: Carol Clark MRN: [id number] DOB: 1995/06/07  Reason for Call:  Transition of Care Hospital Discharge Call  Contact Status: Patient Contact Status: Complete  Language assistant needed: Interpreter Mode: Interpreter Not Needed        Follow-Up Questions: Do You Have Any Concerns About Your Health As You Heal From Delivery?: No Do You Have Any Concerns About Your Infants Health?: Yes What Concerns Do You Have About Your Baby?: Patient notes that baby has been straining to stool recently. Stool consistency is soft. Advised that straining with stooling can be normal for newborns d/t immature muscle coordination and challenge of stooling while laying on back. Discussed that hard stools are concerning for constipation.  Edinburgh Postnatal Depression Scale:  In the Past 7 Days: I have been able to laugh and see the funny side of things.: As much as I always could I have looked forward with enjoyment to things.: As much as I ever did I have blamed myself unnecessarily when things went wrong.: Not very often I have been anxious or worried for no good reason.: Yes, sometimes I have felt scared or panicky for no good reason.: No, not much Things have been getting on top of me.: No, most of the time I have coped quite well I have been so unhappy that I have had difficulty sleeping.: Not at all I have felt sad or miserable.: Not very often I have been so unhappy that I have been crying.: Only occasionally The thought of harming myself has occurred to me.: Never Edinburgh Postnatal Depression Scale Total: 7  PHQ2-9 Depression Scale:     Discharge Follow-up: Edinburgh score requires follow up?: No Patient was advised of the following resources:: Breastfeeding Support Group, Support Group (declines postpartum group information via email)  Post-discharge interventions: Reviewed Newborn Safe Sleep Practices  Mliss Sieve, RN 05/13/2024 11:12

## 2024-05-14 ENCOUNTER — Inpatient Hospital Stay (HOSPITAL_COMMUNITY)

## 2024-05-14 ENCOUNTER — Inpatient Hospital Stay (HOSPITAL_COMMUNITY): Admission: RE | Admit: 2024-05-14 | Source: Home / Self Care | Admitting: Family Medicine

## 2024-05-21 ENCOUNTER — Encounter (HOSPITAL_BASED_OUTPATIENT_CLINIC_OR_DEPARTMENT_OTHER): Payer: Self-pay | Admitting: Obstetrics & Gynecology

## 2024-05-28 ENCOUNTER — Other Ambulatory Visit (HOSPITAL_COMMUNITY): Payer: Self-pay

## 2024-05-28 ENCOUNTER — Other Ambulatory Visit: Payer: Self-pay

## 2024-05-28 MED FILL — Buspirone HCl Tab 5 MG: ORAL | 90 days supply | Qty: 180 | Fill #0 | Status: CN

## 2024-05-28 MED FILL — Buspirone HCl Tab 5 MG: ORAL | 90 days supply | Qty: 180 | Fill #0 | Status: AC

## 2024-06-12 ENCOUNTER — Other Ambulatory Visit (HOSPITAL_BASED_OUTPATIENT_CLINIC_OR_DEPARTMENT_OTHER): Payer: Self-pay

## 2024-06-12 ENCOUNTER — Ambulatory Visit (INDEPENDENT_AMBULATORY_CARE_PROVIDER_SITE_OTHER): Admitting: Obstetrics and Gynecology

## 2024-06-12 ENCOUNTER — Encounter (HOSPITAL_BASED_OUTPATIENT_CLINIC_OR_DEPARTMENT_OTHER): Payer: Self-pay | Admitting: Obstetrics and Gynecology

## 2024-06-12 DIAGNOSIS — Z30011 Encounter for initial prescription of contraceptive pills: Secondary | ICD-10-CM

## 2024-06-12 DIAGNOSIS — Z1331 Encounter for screening for depression: Secondary | ICD-10-CM

## 2024-06-12 DIAGNOSIS — E079 Disorder of thyroid, unspecified: Secondary | ICD-10-CM

## 2024-06-12 DIAGNOSIS — O99013 Anemia complicating pregnancy, third trimester: Secondary | ICD-10-CM

## 2024-06-12 DIAGNOSIS — F418 Other specified anxiety disorders: Secondary | ICD-10-CM

## 2024-06-12 MED ORDER — DROSPIRENONE-ETHINYL ESTRADIOL 3-0.02 MG PO TABS
1.0000 | ORAL_TABLET | Freq: Every day | ORAL | 11 refills | Status: AC
  Filled 2024-06-12: qty 28, 28d supply, fill #0

## 2024-06-12 NOTE — Progress Notes (Signed)
 "   Post Partum Visit Note  Carol Clark is a 29 y.o. G20P2002 female who presents for a postpartum visit. She is 5 weeks postpartum following a normal spontaneous vaginal delivery.  I have fully reviewed the prenatal and intrapartum course. The delivery was at 39.4 gestational weeks.  Anesthesia: epidural. Postpartum course has been good. Baby is doing well. Baby is feeding by bottle - [employer]. Bleeding no bleeding. Bowel function is normal. Bladder function is normal. Patient is not sexually active. Contraception method is abstinence. Postpartum depression screening: positive.   The pregnancy intention screening data noted above was reviewed. Potential methods of contraception were discussed. The patient elected to proceed with POPs   Edinburgh Postnatal Depression Scale - 06/12/24 1135       Edinburgh Postnatal Depression Scale:  In the Past 7 Days   I have been able to laugh and see the funny side of things. 0    I have looked forward with enjoyment to things. 1    I have blamed myself unnecessarily when things went wrong. 2    I have been anxious or worried for no good reason. 2    I have felt scared or panicky for no good reason. 1    Things have been getting on top of me. 1    I have been so unhappy that I have had difficulty sleeping. 1    I have felt sad or miserable. 1    I have been so unhappy that I have been crying. 0    The thought of harming myself has occurred to me. 0    Edinburgh Postnatal Depression Scale Total 9          Health Maintenance Due  Topic Date Due   COVID-19 Vaccine (5 - 2025-26 season) 01/07/2024    The following portions of the patient's history were reviewed and updated as appropriate: allergies, current medications, past family history, past medical history, past social history, past surgical history, and problem list.  Review of Systems Pertinent items are noted in HPI.  Objective:  BP 107/81 (BP Location: Left Arm,  Patient Position: Sitting, Cuff Size: Large)   Pulse 99   SpO2 99%   Breastfeeding No    General:  alert and cooperative     Lungs: Normal effort     Abdomen: nondistended     GU exam:  normal       Assessment:   1. Postpartum exam (Primary)   2. Encounter for initial prescription of contraceptive pills Desires OCP that will help with acne Prescription sent to pharmacy, discussed initiation back up method - drospirenone -ethinyl estradiol  (YAZ) 3-0.02 MG tablet; Take 1 tablet by mouth daily.  Dispense: 28 tablet; Refill: 11  3. Thyroid  disease affecting pregnancy On synthroid   - TSH; Future  4. Anemia during pregnancy in third trimester  - CBC; Future  5. Mixed anxiety and depressive disorder On zoloft  Feels she is doing well overall, has good support Will notify if feels she need change   Plan:   Essential components of care per ACOG recommendations:  1.  Mood and well being: Patient with negative depression screening today. Reviewed local resources for support.  - Patient tobacco use? No.   - hx of drug use? No.    2. Infant care and feeding:  -Patient currently breastmilk feeding? No.  -Social determinants of health (SDOH) reviewed in EPIC. No concerns  3. Sexuality, contraception and birth spacing - Patient does not  want a pregnancy in the next year.  - Reviewed reproductive life planning. Reviewed contraceptive methods based on pt preferences and effectiveness.  Patient desired Oral Contraceptive today.   - Discussed birth spacing of 18 months  4. Sleep and fatigue -Encouraged family/partner/community support-they have a good night time regimen   5. Physical Recovery  - Discussed patients delivery and complications. She describes her labor as good. - Patient had a Vaginal, no problems at delivery. Patient had a 2nd degree laceration. Perineal healing reviewed. Patient expressed understanding - Patient has urinary incontinence? No. - Patient is safe to  resume physical and sexual activity  6.  Health Maintenance - HM due items addressed Yes - Last pap smear  Diagnosis  Date Value Ref Range Status  07/26/2023   Final   - Negative for intraepithelial lesion or malignancy (NILM)   Pap smear not done at today's visit.  -Breast Cancer screening indicated? No.   7. Chronic Disease/Pregnancy Condition follow up: Anemia and Hypothyroidism  - PCP follow up  Nidia Daring, FNP Center for [facility], [facility] "

## 2024-06-17 ENCOUNTER — Ambulatory Visit (HOSPITAL_BASED_OUTPATIENT_CLINIC_OR_DEPARTMENT_OTHER): Payer: Self-pay | Admitting: Obstetrics and Gynecology
